# Patient Record
Sex: Male | Born: 1970 | Hispanic: No | Marital: Married | Smoking: Never smoker
Health system: Southern US, Community
[De-identification: ages and names within clinical notes are randomized; demographics above are authoritative.]

## PROBLEM LIST (undated history)

## (undated) DIAGNOSIS — I10 Essential (primary) hypertension: Secondary | ICD-10-CM

## (undated) DIAGNOSIS — L309 Dermatitis, unspecified: Secondary | ICD-10-CM

## (undated) DIAGNOSIS — J45909 Unspecified asthma, uncomplicated: Secondary | ICD-10-CM

## (undated) HISTORY — PX: VASECTOMY: SHX75

---

## 2008-02-08 ENCOUNTER — Other Ambulatory Visit: Admission: RE | Admit: 2008-02-08 | Discharge: 2008-02-08 | Payer: Self-pay | Admitting: Urology

## 2009-03-15 ENCOUNTER — Emergency Department (HOSPITAL_COMMUNITY): Admission: EM | Admit: 2009-03-15 | Discharge: 2009-03-15 | Payer: Self-pay | Admitting: Emergency Medicine

## 2009-03-27 ENCOUNTER — Emergency Department (HOSPITAL_COMMUNITY): Admission: EM | Admit: 2009-03-27 | Discharge: 2009-03-27 | Payer: Self-pay | Admitting: Family Medicine

## 2016-01-30 ENCOUNTER — Ambulatory Visit: Payer: Self-pay | Admitting: Allergy and Immunology

## 2016-01-31 ENCOUNTER — Ambulatory Visit: Payer: Self-pay | Admitting: Allergy and Immunology

## 2016-03-16 ENCOUNTER — Encounter (HOSPITAL_COMMUNITY): Payer: Self-pay

## 2016-03-16 ENCOUNTER — Emergency Department (HOSPITAL_COMMUNITY): Payer: BC Managed Care – PPO

## 2016-03-16 ENCOUNTER — Emergency Department (HOSPITAL_COMMUNITY)
Admission: EM | Admit: 2016-03-16 | Discharge: 2016-03-16 | Disposition: A | Discharge status: Home / Self Care | Payer: BC Managed Care – PPO | Attending: Emergency Medicine | Admitting: Emergency Medicine

## 2016-03-16 DIAGNOSIS — I10 Essential (primary) hypertension: Secondary | ICD-10-CM | POA: Insufficient documentation

## 2016-03-16 DIAGNOSIS — J45909 Unspecified asthma, uncomplicated: Secondary | ICD-10-CM | POA: Insufficient documentation

## 2016-03-16 DIAGNOSIS — Z79899 Other long term (current) drug therapy: Secondary | ICD-10-CM | POA: Diagnosis not present

## 2016-03-16 DIAGNOSIS — J069 Acute upper respiratory infection, unspecified: Secondary | ICD-10-CM | POA: Insufficient documentation

## 2016-03-16 DIAGNOSIS — R0602 Shortness of breath: Secondary | ICD-10-CM | POA: Diagnosis present

## 2016-03-16 HISTORY — DX: Essential (primary) hypertension: I10

## 2016-03-16 HISTORY — DX: Unspecified asthma, uncomplicated: J45.909

## 2016-03-16 HISTORY — DX: Dermatitis, unspecified: L30.9

## 2016-03-16 LAB — CBC
HCT: 52 % (ref 39.0–52.0)
Hemoglobin: 18.1 g/dL — ABNORMAL HIGH (ref 13.0–17.0)
MCH: 31.6 pg (ref 26.0–34.0)
MCHC: 34.8 g/dL (ref 30.0–36.0)
MCV: 90.9 fL (ref 78.0–100.0)
PLATELETS: 232 10*3/uL (ref 150–400)
RBC: 5.72 MIL/uL (ref 4.22–5.81)
RDW: 12.7 % (ref 11.5–15.5)
WBC: 7.3 10*3/uL (ref 4.0–10.5)

## 2016-03-16 LAB — I-STAT TROPONIN, ED
TROPONIN I, POC: 0 ng/mL (ref 0.00–0.08)
Troponin i, poc: 0 ng/mL (ref 0.00–0.08)

## 2016-03-16 LAB — URINALYSIS, ROUTINE W REFLEX MICROSCOPIC
BILIRUBIN URINE: NEGATIVE
GLUCOSE, UA: NEGATIVE mg/dL
HGB URINE DIPSTICK: NEGATIVE
Ketones, ur: NEGATIVE mg/dL
Leukocytes, UA: NEGATIVE
NITRITE: NEGATIVE
PH: 8 (ref 5.0–8.0)
Protein, ur: NEGATIVE mg/dL
SPECIFIC GRAVITY, URINE: 1.009 (ref 1.005–1.030)

## 2016-03-16 LAB — BASIC METABOLIC PANEL
Anion gap: 10 (ref 5–15)
BUN: 7 mg/dL (ref 6–20)
CHLORIDE: 98 mmol/L — AB (ref 101–111)
CO2: 29 mmol/L (ref 22–32)
CREATININE: 1.32 mg/dL — AB (ref 0.61–1.24)
Calcium: 9.8 mg/dL (ref 8.9–10.3)
GFR calc Af Amer: >60 mL/min (ref >60)
GFR calc non Af Amer: >60 mL/min (ref >60)
GLUCOSE: 91 mg/dL (ref 65–99)
Potassium: 3.5 mmol/L (ref 3.5–5.1)
Sodium: 137 mmol/L (ref 135–145)

## 2016-03-16 LAB — D-DIMER, QUANTITATIVE: D-Dimer, Quant: 0.33 ug/mL-FEU (ref 0.00–0.50)

## 2016-03-16 MED ORDER — ACETAMINOPHEN 325 MG PO TABS
650.0000 mg | ORAL_TABLET | Freq: Once | ORAL | Status: AC
Start: 2016-03-16 — End: 2016-03-16
  Administered 2016-03-16: 650 mg via ORAL
  Filled 2016-03-16: qty 2

## 2016-03-16 MED ORDER — SODIUM CHLORIDE 0.9 % IV BOLUS (SEPSIS)
1000.0000 mL | Freq: Once | INTRAVENOUS | Status: AC
Start: 2016-03-16 — End: 2016-03-16
  Administered 2016-03-16: 1000 mL via INTRAVENOUS

## 2016-03-16 MED ORDER — IPRATROPIUM-ALBUTEROL 0.5-2.5 (3) MG/3ML IN SOLN
3.0000 mL | Freq: Once | RESPIRATORY_TRACT | Status: AC
  Administered 2016-03-16: 3 mL via RESPIRATORY_TRACT
  Filled 2016-03-16: qty 3

## 2016-03-16 NOTE — ED Notes (Signed)
Lab at bedside

## 2016-03-16 NOTE — ED Provider Notes (Signed)
[facility] DEPT Provider Note   CSN: [id number] Arrival date & time: 03/16/16  1116     History   Chief Complaint Chief Complaint  Patient presents with  . Chest Pain  . Shortness of Breath    HPI Nicholas Pagliarulo. is a 45 y.o. male.  HPI Nicholas Presto. is a 45 y.o. male with PMH significant for asthma, eczema, and hypertension who presents with gradual onset, constant, mild, non-radiating chest pain that began last night. He describes it as a pressure. Associated symptoms include shortness of breath and feeling lightheaded. He states he has also been having nasal congestion, chest congestion, intermittent cough, frontal headache, and myalgias. He denies any fever, chills, wheezing, slurred speech, unilateral weakness, numbness, visual disturbances, eye pain, nausea, vomiting, diarrhea, or abdominal pain. He endorses sick contacts with similar symptoms. He has been taking over-the-counter cold medicines with minimal relief. Chest pain is not exertional or pleuritic. He denies any current chest pain. No family history of MI prior to age 43. No personal cardiac history. No history of DVT/PE. No recent surgery or immobilization. No unilateral leg swelling. He does not smoke.  He states he just moved here from [place] and has a new PCP.  Past Medical History:  Diagnosis Date  . Asthma   . Eczema   . Hypertension     There are no active problems to display for this patient.   History reviewed. No pertinent surgical history.     Home Medications    Prior to Admission medications   Medication Sig Start Date End Date Taking? Authorizing Provider  albuterol (PROVENTIL HFA;VENTOLIN HFA) 108 (90 Base) MCG/ACT inhaler Inhale 2 puffs into the lungs every 6 (six) hours as needed for wheezing or shortness of breath.   Yes Historical Provider, MD  albuterol (PROVENTIL) (2.5 MG/3ML) 0.083% nebulizer solution Take 2.5 mg by nebulization every 6 (six) hours as needed for  wheezing or shortness of breath.   Yes Historical Provider, MD  budesonide-formoterol (SYMBICORT) 160-4.5 MCG/ACT inhaler Inhale 2 puffs into the lungs 2 (two) times daily.   Yes Historical Provider, MD  loratadine (CLARITIN) 10 MG tablet Take 10 mg by mouth daily.   Yes Historical Provider, MD  triamterene-hydrochlorothiazide (MAXZIDE-25) 37.5-25 MG tablet Take 1 tablet by mouth daily.   Yes Historical Provider, MD    Family History History reviewed. No pertinent family history.  Social History Social History  Substance Use Topics  . Smoking status: Never Smoker  . Smokeless tobacco: Never Used  . Alcohol use No     Allergies   Patient has no known allergies.   Review of Systems Review of Systems All other systems negative unless otherwise stated in HPI   Physical Exam Updated Vital Signs BP 131/92 (BP Location: Right Arm)   Pulse 98   Temp 99.9 F (37.7 C) (Oral)   Resp 22   Ht 5' 5"  (1.651 m)   Wt 78.2 kg   SpO2 97%   BMI 28.70 kg/m   Physical Exam  Constitutional: He is oriented to person, place, and time. He appears well-developed and well-nourished. He is active.  Non-toxic appearance. He does not have a sickly appearance. He does not appear ill.  HENT:  Head: Normocephalic and atraumatic.  Right Ear: Tympanic membrane and external ear normal. Tympanic membrane is not erythematous and not bulging.  Left Ear: Tympanic membrane and external ear normal. Tympanic membrane is not erythematous and not bulging.  Nose: Nose normal. Right sinus  exhibits no maxillary sinus tenderness and no frontal sinus tenderness. Left sinus exhibits no maxillary sinus tenderness and no frontal sinus tenderness.  Mouth/Throat: Uvula is midline, oropharynx is clear and moist and mucous membranes are normal. No trismus in the jaw. No uvula swelling. No oropharyngeal exudate, posterior oropharyngeal edema, posterior oropharyngeal erythema or tonsillar abscesses.  Neck: Normal range of  motion. Neck supple.  No nuchal rigidity.   Cardiovascular: Regular rhythm.  Tachycardia present.   Pulmonary/Chest: Effort normal and breath sounds normal. No respiratory distress. He has no wheezes. He has no rales.  Abdominal: Soft. Bowel sounds are normal. He exhibits no distension. There is no tenderness.  Musculoskeletal: Normal range of motion.  Lymphadenopathy:    He has no cervical adenopathy.  Neurological: He is alert and oriented to person, place, and time.  Mental Status:   AOx3.  Speech clear without dysarthria. Cranial Nerves:  I-not tested  II-PERRLA  III, IV, VI-EOMs intact  V-temporal and masseter strength intact  VII-symmetrical facial movements intact, no facial droop  VIII-hearing grossly intact bilaterally  IX, X-gag intact  XI-strength of sternomastoid and trapezius muscles 5/5  XII-tongue midline Motor:   Good muscle bulk and tone  Strength 5/5 bilaterally in upper and lower extremities   Cerebellar--intact RAMs, finger to nose intact bilaterally.  Gait normal  No pronator drift Sensory:  Intact in upper and lower extremities   Skin: Skin is warm and dry.  Psychiatric: He has a normal mood and affect. His behavior is normal.     ED Treatments / Results  Labs (all labs ordered are listed, but only abnormal results are displayed) Labs Reviewed  BASIC METABOLIC PANEL - Abnormal; Notable for the following:       Result Value   Chloride 98 (*)    Creatinine, Ser 1.32 (*)    All other components within normal limits  CBC - Abnormal; Notable for the following:    Hemoglobin 18.1 (*)    All other components within normal limits  URINALYSIS, ROUTINE W REFLEX MICROSCOPIC (NOT AT [facility])  D-DIMER, QUANTITATIVE (NOT AT [facility])  Randolm Idol, ED  I-STAT TROPOININ, ED    EKG  EKG Interpretation  Date/Time:  Monday March 16 2016 11:26:32 EST Ventricular Rate:  102 PR Interval:  132 QRS Duration: 96 QT Interval:  326 QTC Calculation: 424 R  Axis:   55 Text Interpretation:  Sinus tachycardia Otherwise normal ECG No old tracing to compare Confirmed by GOLDSTON MD, SCOTT [id number]) on 03/16/2016 11:47:04 AM       Radiology Dg Chest 2 View  Result Date: 03/16/2016 CLINICAL DATA:  Acute chest pain. EXAM: CHEST  2 VIEW COMPARISON:  04/03/2015 and prior radiographs FINDINGS: This is a mildly low volume film. The cardiomediastinal silhouette is unremarkable. There is no evidence of focal airspace disease, pulmonary edema, suspicious pulmonary nodule/mass, pleural effusion, or pneumothorax. No acute bony abnormalities are identified. IMPRESSION: Mildly low volume film -no evidence of active cardiopulmonary disease. Electronically Signed   By: Margarette Canada M.D.   On: 03/16/2016 14:51    Procedures Procedures (including critical care time)  Medications Ordered in ED Medications  acetaminophen (TYLENOL) tablet 650 mg (not administered)  sodium chloride 0.9 % bolus 1,000 mL (0 mLs Intravenous Stopped 03/16/16 1555)  ipratropium-albuterol (DUONEB) 0.5-2.5 (3) MG/3ML nebulizer solution 3 mL (3 mLs Nebulization Given 03/16/16 1407)     Initial Impression / Assessment and Plan / ED Course  I have reviewed the triage vital signs and the  nursing notes.  Pertinent labs & imaging results that were available during my care of the patient were reviewed by me and considered in my medical decision making (see chart for details).  Clinical Course    Patient presents with likely viral URI.  CXR negative for acute infiltrate.  EKG without ischemia.  Delta troponin negative.  Dimer negative.  Low suspicion for ACS (HEAR score 2), PE, dissection, PNA, or pericarditis.  Labs without acute abnormalities.  HR improved with fluids.  Discussed supportive care and plenty of fluids.  No indication for abx at this time.  Follow up PCP.  Return precautions discussed including worsening symptoms, chest pain, shortness of breath, fever, or any new or concerning  symptoms.   Case has been discussed with Dr. Regenia Skeeter who agrees with the above plan for discharge.   Final Clinical Impressions(s) / ED Diagnoses   Final diagnoses:  Upper respiratory tract infection, unspecified type    New Prescriptions New Prescriptions   No medications on file     Gloriann Loan, PA-C 03/16/16 [place], MD 03/17/16 [phone]

## 2016-03-16 NOTE — ED Triage Notes (Signed)
Pt. Reports having anterior chest pain beginning last night described as a tightness.  Denies any aggravating factor. The pain is non-radiating . He also is having sob. Lightheaded and dizziness.  Mild cough, pt. Has a hx. Of asthma, Skin is warm and dry.  NO acute distress in the ED.  ECG completed in Triage, Lungs clear bilaterally

## 2016-03-16 NOTE — ED Notes (Signed)
Pt returned from X Ray.

## 2017-05-14 ENCOUNTER — Encounter (HOSPITAL_COMMUNITY): Payer: Self-pay

## 2017-05-14 ENCOUNTER — Emergency Department (HOSPITAL_COMMUNITY)
Admission: EM | Admit: 2017-05-14 | Discharge: 2017-05-14 | Disposition: A | Discharge status: Home / Self Care | Payer: BC Managed Care – PPO | Attending: Emergency Medicine | Admitting: Emergency Medicine

## 2017-05-14 ENCOUNTER — Other Ambulatory Visit: Payer: Self-pay

## 2017-05-14 DIAGNOSIS — M62830 Muscle spasm of back: Secondary | ICD-10-CM | POA: Insufficient documentation

## 2017-05-14 DIAGNOSIS — I10 Essential (primary) hypertension: Secondary | ICD-10-CM | POA: Diagnosis not present

## 2017-05-14 DIAGNOSIS — J45909 Unspecified asthma, uncomplicated: Secondary | ICD-10-CM | POA: Insufficient documentation

## 2017-05-14 DIAGNOSIS — Z79899 Other long term (current) drug therapy: Secondary | ICD-10-CM | POA: Insufficient documentation

## 2017-05-14 DIAGNOSIS — M549 Dorsalgia, unspecified: Secondary | ICD-10-CM | POA: Diagnosis present

## 2017-05-14 MED ORDER — CYCLOBENZAPRINE HCL 10 MG PO TABS: 10.0000 mg | ORAL_TABLET | Freq: Two times a day (BID) | ORAL | 0 refills | Status: DC | PRN

## 2017-05-14 MED ORDER — METHYLPREDNISOLONE SODIUM SUCC 125 MG IJ SOLR
80.0000 mg | Freq: Once | INTRAMUSCULAR | Status: AC
  Administered 2017-05-14: 80 mg via INTRAMUSCULAR
  Filled 2017-05-14: qty 2

## 2017-05-14 NOTE — ED Triage Notes (Signed)
Pt reports lifting something heavy on Monday and then began experiencing lower back pain on Wednesday. He reports that the pain feels like "muscle spasms." Denies any urinary symptoms. A&Ox4. Ambulatory,

## 2017-05-14 NOTE — ED Notes (Signed)
PT DISCHARGED. INSTRUCTIONS AND PRESCRIPTION GIVEN. AAOX4. PT IN NO APPARENT DISTRESS WITH SEVERE PAIN. THE OPPORTUNITY TO ASK QUESTIONS WAS PROVIDED.

## 2017-05-14 NOTE — Discharge Instructions (Signed)
Do not drive or work while taking the muscle relaxer as it will make you sleepy. Take your Advil as directed. Follow up with your doctor or return here for worsening symptoms.

## 2017-05-14 NOTE — ED Provider Notes (Signed)
[facility] DEPT Provider Note   CSN: [id number] Arrival date & time: 05/14/17  1732     History   Chief Complaint Chief Complaint  Patient presents with  . Back Pain    HPI Nicholas Moss. is a 47 y.o. male who presents to the ED with low back pain. The pain started 4 days ago after lifting and moving stuff on his job that was different from his usual job. Patient reports the feeling of muscle spasm. Patient denies loss of control of bladder or any UTI symptoms. Patient reports having similar back problems in the past and treated for muscle spasm.  Patient denies any other problems.   HPI  Past Medical History:  Diagnosis Date  . Asthma   . Eczema   . Hypertension     There are no active problems to display for this patient.   History reviewed. No pertinent surgical history.     Home Medications    Prior to Admission medications   Medication Sig Start Date End Date Taking? Authorizing Provider  budesonide-formoterol (SYMBICORT) 160-4.5 MCG/ACT inhaler Inhale 2 puffs into the lungs 2 (two) times daily.   Yes [provider]  Diclofenac Sodium (PENNSAID) 2 % SOLN Place 2 Bottles onto the skin 2 (two) times daily as needed.   Yes [provider]  ibuprofen (ADVIL,MOTRIN) 200 MG tablet Take 400 mg by mouth 2 (two) times daily as needed (BACK PAIN).   Yes [provider]  triamterene-hydrochlorothiazide (MAXZIDE-25) 37.5-25 MG tablet Take 1 tablet by mouth daily.   Yes [provider]  cyclobenzaprine (FLEXERIL) 10 MG tablet Take 1 tablet (10 mg total) by mouth 2 (two) times daily as needed for muscle spasms. 05/14/17   Ashley Murrain, NP    Family History History reviewed. No pertinent family history.  Social History Social History   Tobacco Use  . Smoking status: Never Smoker  . Smokeless tobacco: Never Used  Substance Use Topics  . Alcohol use: No  . Drug use: No     Allergies   Patient  has no known allergies.   Review of Systems Review of Systems  Musculoskeletal: Positive for back pain.  All other systems reviewed and are negative.    Physical Exam Updated Vital Signs BP (!) 126/96 (BP Location: Left Arm)   Pulse 66   Temp 98.3 F (36.8 C) (Oral)   Resp 18   SpO2 99%   Physical Exam  Constitutional: He appears well-developed and well-nourished. No distress.  HENT:  Head: Normocephalic and atraumatic.  Eyes: Conjunctivae and EOM are normal.  Neck: Normal range of motion. Neck supple.  Cardiovascular: Normal rate and regular rhythm.  Pulmonary/Chest: Breath sounds normal. He is in respiratory distress.  Abdominal: There is no CVA tenderness.  Musculoskeletal:       Lumbar back: He exhibits tenderness and spasm. He exhibits no deformity and normal pulse. Decreased range of motion: due to pain.       Back:  Neurological: He is alert. He has normal strength. No cranial nerve deficit or sensory deficit. Gait normal.  Reflex Scores:      Bicep reflexes are 2+ on the right side and 2+ on the left side.      Brachioradialis reflexes are 2+ on the right side and 2+ on the left side.      Patellar reflexes are 2+ on the right side and 2+ on the left side. Skin: Skin is warm and dry.  Psychiatric: He has a normal mood and affect. His behavior is normal.  Nursing note and vitals reviewed.    ED Treatments / Results  Labs (all labs ordered are listed, but only abnormal results are displayed) Labs Reviewed - No data to display  EKG  EKG Interpretation None       Radiology No results found.  Procedures Procedures (including critical care time)  Medications Ordered in ED Medications  methylPREDNISolone sodium succinate (SOLU-MEDROL) 125 mg/2 mL injection 80 mg (not administered)     Initial Impression / Assessment and Plan / ED Course  I have reviewed the triage vital signs and the nursing notes. Patient with back pain.  No neurological deficits  and normal neuro exam.  Patient can walk but states is painful.  No loss of bowel or bladder control.  No concern for cauda equina.  No fever, night sweats, weight loss, h/o cancer, IVDU.  RICE protocol and pain medicine indicated and discussed with patient.  Final Clinical Impressions(s) / ED Diagnoses   Final diagnoses:  Muscle spasm of back    ED Discharge Orders        Ordered    cyclobenzaprine (FLEXERIL) 10 MG tablet  2 times daily PRN     05/14/17 1903       Debroah Baller [place], NP 05/14/17 1910    Davonna Belling, MD 05/14/17 [phone]

## 2017-11-21 ENCOUNTER — Encounter (HOSPITAL_COMMUNITY): Payer: Self-pay | Admitting: Obstetrics and Gynecology

## 2017-11-21 ENCOUNTER — Other Ambulatory Visit: Payer: Self-pay

## 2017-11-21 ENCOUNTER — Emergency Department (HOSPITAL_COMMUNITY)
Admission: EM | Admit: 2017-11-21 | Discharge: 2017-11-21 | Disposition: A | Discharge status: Home / Self Care | Payer: BC Managed Care – PPO | Attending: Emergency Medicine | Admitting: Emergency Medicine

## 2017-11-21 DIAGNOSIS — Y93E5 Activity, floor mopping and cleaning: Secondary | ICD-10-CM | POA: Diagnosis not present

## 2017-11-21 DIAGNOSIS — J45909 Unspecified asthma, uncomplicated: Secondary | ICD-10-CM | POA: Diagnosis not present

## 2017-11-21 DIAGNOSIS — S39012A Strain of muscle, fascia and tendon of lower back, initial encounter: Secondary | ICD-10-CM | POA: Diagnosis not present

## 2017-11-21 DIAGNOSIS — Z79899 Other long term (current) drug therapy: Secondary | ICD-10-CM | POA: Insufficient documentation

## 2017-11-21 DIAGNOSIS — I10 Essential (primary) hypertension: Secondary | ICD-10-CM | POA: Diagnosis not present

## 2017-11-21 DIAGNOSIS — L309 Dermatitis, unspecified: Secondary | ICD-10-CM | POA: Diagnosis not present

## 2017-11-21 DIAGNOSIS — S3992XA Unspecified injury of lower back, initial encounter: Secondary | ICD-10-CM | POA: Diagnosis present

## 2017-11-21 DIAGNOSIS — M62830 Muscle spasm of back: Secondary | ICD-10-CM | POA: Insufficient documentation

## 2017-11-21 DIAGNOSIS — Y92219 Unspecified school as the place of occurrence of the external cause: Secondary | ICD-10-CM | POA: Diagnosis not present

## 2017-11-21 DIAGNOSIS — Y990 Civilian activity done for income or pay: Secondary | ICD-10-CM | POA: Insufficient documentation

## 2017-11-21 DIAGNOSIS — X500XXA Overexertion from strenuous movement or load, initial encounter: Secondary | ICD-10-CM | POA: Diagnosis not present

## 2017-11-21 MED ORDER — PREDNISONE 20 MG PO TABS
60.0000 mg | ORAL_TABLET | Freq: Once | ORAL | Status: AC
  Administered 2017-11-21: 60 mg via ORAL
  Filled 2017-11-21: qty 3

## 2017-11-21 MED ORDER — METHOCARBAMOL 500 MG PO TABS: 500.0000 mg | ORAL_TABLET | Freq: Two times a day (BID) | ORAL | 0 refills | Status: DC

## 2017-11-21 MED ORDER — DICLOFENAC SODIUM 50 MG PO TBEC: 50.0000 mg | DELAYED_RELEASE_TABLET | Freq: Two times a day (BID) | ORAL | 0 refills | Status: DC

## 2017-11-21 MED ORDER — KETOROLAC TROMETHAMINE 60 MG/2ML IM SOLN
30.0000 mg | Freq: Once | INTRAMUSCULAR | Status: AC
  Administered 2017-11-21: 30 mg via INTRAMUSCULAR
  Filled 2017-11-21: qty 2

## 2017-11-21 MED ORDER — PREDNISONE 50 MG PO TABS: ORAL_TABLET | ORAL | 0 refills | Status: DC

## 2017-11-21 NOTE — ED Triage Notes (Signed)
Per Pt: Pt reports he works for [employer] and they have been taking up the floors, waxing, and placing new floors, which requires a lot of heavy lifting. Pt reports he is having back pain. Pt denies it migrating down either leg. Pt denies urinary symptoms. Pt reports he was taking flexeril, but has had no relief.  Pt able to move all limbs in triage, but reports he feels "stiff"

## 2017-11-21 NOTE — ED Provider Notes (Signed)
[facility] DEPT Provider Note   CSN: [id number] Arrival date & time: 11/21/17  1211     History   Chief Complaint Chief Complaint  Patient presents with  . Back Pain    HPI Nicholas Moss. is a 47 y.o. male who presents to the ED with back pain. Patient reports he works for [employer] and they have been taking up the floors, waxing and putting in new floors. Patient reports that this job has required heavy lifting and has caused him to have back pain. Patient reports taking Flexeril without relief. Patient denies loss of control of bladder or bowels and states the pain does not radiate.   The history is provided by the patient. No language interpreter was used.  Back Pain   The current episode started more than 2 days ago. The problem occurs constantly. The problem has been gradually worsening. The pain is associated with lifting heavy objects. The pain is present in the lumbar spine. The quality of the pain is described as aching. The pain does not radiate. The pain is at a severity of 9/10. The symptoms are aggravated by bending, twisting and certain positions. The pain is the same all the time. Pertinent negatives include no fever, no numbness, no abdominal pain, no bowel incontinence, no bladder incontinence, no dysuria, no paresis and no weakness. He has tried muscle relaxants and heat for the symptoms. The treatment provided mild relief.    Past Medical History:  Diagnosis Date  . Asthma   . Eczema   . Hypertension     There are no active problems to display for this patient.   History reviewed. No pertinent surgical history.      Home Medications    Prior to Admission medications   Medication Sig Start Date End Date Taking? Authorizing Provider  budesonide-formoterol (SYMBICORT) 160-4.5 MCG/ACT inhaler Inhale 2 puffs into the lungs 2 (two) times daily.    [provider]  diclofenac (VOLTAREN) 50 MG EC tablet Take 1  tablet (50 mg total) by mouth 2 (two) times daily. 11/21/17   Ashley Murrain, NP  Diclofenac Sodium (PENNSAID) 2 % SOLN Place 2 Bottles onto the skin 2 (two) times daily as needed.    [provider]  methocarbamol (ROBAXIN) 500 MG tablet Take 1 tablet (500 mg total) by mouth 2 (two) times daily. 11/21/17   Ashley Murrain, NP  predniSONE (DELTASONE) 50 MG tablet Starting tomorrow 11/22/17 take one tablet PO daily 11/21/17   Ashley Murrain, NP  triamterene-hydrochlorothiazide (MAXZIDE-25) 37.5-25 MG tablet Take 1 tablet by mouth daily.    [provider]    Family History No family history on file.  Social History Social History   Tobacco Use  . Smoking status: Never Smoker  . Smokeless tobacco: Never Used  Substance Use Topics  . Alcohol use: No  . Drug use: No     Allergies   Patient has no known allergies.   Review of Systems Review of Systems  Constitutional: Negative for fever.  HENT: Negative.   Eyes: Negative for visual disturbance.  Gastrointestinal: Negative for abdominal pain and bowel incontinence.  Genitourinary: Negative for bladder incontinence and dysuria.  Musculoskeletal: Positive for back pain.  Skin: Positive for rash.       Itching, eczema  Neurological: Negative for weakness and numbness.     Physical Exam Updated Vital Signs BP 113/78   Pulse 91   Temp 98.2 F (36.8 C) (  Oral)   Resp 16   SpO2 98%   Physical Exam  Constitutional: He appears well-developed and well-nourished. No distress.  HENT:  Head: Normocephalic.  Eyes: Conjunctivae and EOM are normal.  Neck: Normal range of motion. Neck supple.  Cardiovascular: Normal rate.  Pulmonary/Chest: Effort normal.  Abdominal: Soft. There is no tenderness.  Musculoskeletal:       Lumbar back: He exhibits tenderness and spasm. He exhibits normal pulse. Decreased range of motion: due to pain.       Back:  Grips are equal, radial pulses 2+.   Neurological: He is alert. He has  normal strength. Gait abnormal.  Reflex Scores:      Bicep reflexes are 2+ on the right side and 2+ on the left side.      Brachioradialis reflexes are 2+ on the right side and 2+ on the left side.      Patellar reflexes are 2+ on the right side. Stands on one foot without difficulty  Skin: Skin is warm and dry. Rash noted.  Eczema to forearms, patient being treated with Kenalog Cream.  Psychiatric: He has a normal mood and affect.  Nursing note and vitals reviewed.    ED Treatments / Results  Labs (all labs ordered are listed, but only abnormal results are displayed) Labs Reviewed - No data to display  EKG None  Radiology No results found.  Procedures Procedures (including critical care time)  Medications Ordered in ED Medications  ketorolac (TORADOL) injection 30 mg (30 mg Intramuscular Given 11/21/17 1309)  predniSONE (DELTASONE) tablet 60 mg (60 mg Oral Given 11/21/17 1309)     Initial Impression / Assessment and Plan / ED Course  I have reviewed the triage vital signs and the nursing notes. Patient with back pain.  No neurological deficits and normal neuro exam.  Patient can walk but states is painful.  No loss of bowel or bladder control.  No concern for cauda equina.  No fever, night sweats, weight loss, h/o cancer, IVDU.  RICE protocol and pain medicine indicated and discussed with patient.   Final Clinical Impressions(s) / ED Diagnoses   Final diagnoses:  Lumbar strain, initial encounter  Spasm of muscle of lower back    ED Discharge Orders         Ordered    methocarbamol (ROBAXIN) 500 MG tablet  2 times daily     11/21/17 1325    predniSONE (DELTASONE) 50 MG tablet     11/21/17 1325    diclofenac (VOLTAREN) 50 MG EC tablet  2 times daily     11/21/17 [place], [place], [place] 11/21/17 1327    Quintella Reichert, MD 11/22/17 0800

## 2017-11-21 NOTE — Discharge Instructions (Addendum)
Follow up with your doctor. Return here as needed. Do not drive while taking the muscle relaxer as it will make you sleepy.

## 2017-12-10 ENCOUNTER — Emergency Department (HOSPITAL_COMMUNITY)
Admission: EM | Admit: 2017-12-10 | Discharge: 2017-12-10 | Disposition: A | Discharge status: Home / Self Care | Payer: BC Managed Care – PPO | Attending: Emergency Medicine | Admitting: Emergency Medicine

## 2017-12-10 ENCOUNTER — Encounter (HOSPITAL_COMMUNITY): Payer: Self-pay | Admitting: *Deleted

## 2017-12-10 ENCOUNTER — Other Ambulatory Visit: Payer: Self-pay

## 2017-12-10 DIAGNOSIS — R21 Rash and other nonspecific skin eruption: Secondary | ICD-10-CM | POA: Diagnosis present

## 2017-12-10 DIAGNOSIS — L309 Dermatitis, unspecified: Secondary | ICD-10-CM | POA: Diagnosis not present

## 2017-12-10 DIAGNOSIS — I10 Essential (primary) hypertension: Secondary | ICD-10-CM | POA: Insufficient documentation

## 2017-12-10 DIAGNOSIS — J45909 Unspecified asthma, uncomplicated: Secondary | ICD-10-CM | POA: Insufficient documentation

## 2017-12-10 DIAGNOSIS — Z79899 Other long term (current) drug therapy: Secondary | ICD-10-CM | POA: Diagnosis not present

## 2017-12-10 MED ORDER — DEXAMETHASONE SODIUM PHOSPHATE 10 MG/ML IJ SOLN
10.0000 mg | Freq: Once | INTRAMUSCULAR | Status: AC
  Administered 2017-12-10: 10 mg via INTRAMUSCULAR
  Filled 2017-12-10: qty 1

## 2017-12-10 MED ORDER — PREDNISONE 20 MG PO TABS: ORAL_TABLET | ORAL | 0 refills | Status: DC

## 2017-12-10 NOTE — ED Provider Notes (Signed)
[facility] DEPT Provider Note   CSN: [id number] Arrival date & time: 12/10/17  0034  Time seen 02:07 AM   History   Chief Complaint Chief Complaint  Patient presents with  . Rash    HPI Nicholas Moss is a 47 y.o. male.  HPI patient states he has a history of eczema.  He states when it first started out he had it on his legs but he has had it on other parts of his body.  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  He was seen by his PCP and referred to a dermatologist in July.  He was given a tube of triamcinolone ointment and then he went back and he got a tub of 454 g of triamcinolone acetonide that he used for 2 weeks and then he was told to stop.  He states his rash was better.  He also has been using Sarna over-the-counter.  When I look at that it has camphor 0.3% and menthol 0.5%.  He also has a prescription of Atarax 25 mg number 90 tablets that were prescribed December 29, 2016.  He states he "ran out" of Benadryl.  He reports he was out of work last week because he hurt his back.  He restarted back to work on Monday, August 26.  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  He is not sure if they are latex or latex free.  However his rash has been getting worse.  He reports over the past 2days increased itching and the rash is getting more prominent.  [facility]  Past Medical History:  Diagnosis Date  . Asthma   . Eczema   . Hypertension     There are no active problems to display for this patient.   History reviewed. No pertinent surgical history.      Home Medications    Prior to Admission medications   Medication Sig Start Date End Date Taking? Authorizing Provider  budesonide-formoterol (SYMBICORT) 160-4.5 MCG/ACT inhaler Inhale 2 puffs into the lungs 2 (two) times daily.    [provider]  diclofenac (VOLTAREN) 50  MG EC tablet Take 1 tablet (50 mg total) by mouth 2 (two) times daily. 11/21/17   Ashley Murrain, NP  Diclofenac Sodium (PENNSAID) 2 % SOLN Place 2 Bottles onto the skin 2 (two) times daily as needed.    [provider]  methocarbamol (ROBAXIN) 500 MG tablet Take 1 tablet (500 mg total) by mouth 2 (two) times daily. 11/21/17   Ashley Murrain, NP  predniSONE (DELTASONE) 20 MG tablet Take 3 po QD x 3d , then 2 po QD x 3d then 1 po QD x 3d 12/10/17   Rolland Porter, MD  triamterene-hydrochlorothiazide (MAXZIDE-25) 37.5-25 MG tablet Take 1 tablet by mouth daily.    [provider]    Family History No family history on file.  Social History Social History   Tobacco Use  . Smoking status: Never Smoker  . Smokeless tobacco: Never Used  Substance Use Topics  . Alcohol use: No  . Drug use: No  employed   Allergies   Patient has no known allergies.   Review of Systems Review of Systems  All other systems reviewed and are negative.    Physical Exam Updated Vital Signs BP (!) 140/103 (BP Location: Left Arm)   Pulse (!) 104  Temp 98 F (36.7 C) (Oral)   Resp 19   SpO2 96%   Vital signs normal except tachycardia, hypertension   Physical Exam  Constitutional: He is oriented to person, place, and time. He appears well-developed and well-nourished. No distress.  HENT:  Head: Normocephalic and atraumatic.  Right Ear: External ear normal.  Left Ear: External ear normal.  Nose: Nose normal.  Eyes: Conjunctivae and EOM are normal.  Neck: Normal range of motion.  Cardiovascular: Normal rate.  Pulmonary/Chest: Effort normal. No respiratory distress.  Musculoskeletal: Normal range of motion. He exhibits no deformity.  Neurological: He is alert and oriented to person, place, and time. A cranial nerve deficit is present.  Skin: Skin is warm and dry. There is erythema.  Patient has well-circumscribed areas on his forearms and the dorsum of his hand of rash with some  thickening of the skin, some redness.  There is no drainage.  Patient is seem to be scratching during our interview.  Nursing note and vitals reviewed.   Left arm    Right arm     ED Treatments / Results  Labs (all labs ordered are listed, but only abnormal results are displayed) Labs Reviewed - No data to display  EKG None  Radiology No results found.  Procedures Procedures (including critical care time)  Medications Ordered in ED Medications  dexamethasone (DECADRON) injection 10 mg (10 mg Intramuscular Given 12/10/17 0255)     Initial Impression / Assessment and Plan / ED Course  I have reviewed the triage vital signs and the nursing notes.  Pertinent labs & imaging results that were available during my care of the patient were reviewed by me and considered in my medical decision making (see chart for details).     Patient was given Decadron IM.  He was discharged home with a course of prednisone.  We discussed he should start using the triamcinolone cream again.  Once he is finished the steroids he should contact the dermatologist to see how long he should wait to get allergy tested.  We also discussed checking his gloves at work to make sure they are latex free in case that is making his rash worse.  He was advised to return if they appear to be getting infected.  Patient has Atarax pills left over and he also can take Benadryl over-the-counter, no prescriptions necessary.  Final Clinical Impressions(s) / ED Diagnoses   Final diagnoses:  Eczema, unspecified type    ED Discharge Orders         Ordered    predniSONE (DELTASONE) 20 MG tablet     12/10/17 0245          Plan discharge  Rolland Porter, MD, Barbette Or, MD 12/10/17 [phone]

## 2017-12-10 NOTE — ED Triage Notes (Signed)
Pt says he was using Triamcinolone Cream and hydroxizine for a rash he had in July, now the rash is getting worse. Rash is only on his arms and is itchy.

## 2017-12-10 NOTE — Discharge Instructions (Addendum)
Check your gloves at work and make sure they do not have latex in them.  If they do start using latex free gloves.  Start using the triamcinolone cream the dermatologist gave you.  Take the prednisone taper as prescribed.  Once you are off the steroids you can contact the dermatologist office again to see about getting the allergy testing done.  Return to the emergency department if your rash gets worse, you feel like the rash is getting infected and it is draining pus, you get a fever, your skin gets increased redness and warmth.

## 2018-03-01 ENCOUNTER — Ambulatory Visit: Payer: BC Managed Care – PPO | Admitting: Family

## 2018-04-27 ENCOUNTER — Emergency Department (HOSPITAL_COMMUNITY)
Admission: EM | Admit: 2018-04-27 | Discharge: 2018-04-28 | Disposition: A | Discharge status: Home / Self Care | Payer: BC Managed Care – PPO | Attending: Emergency Medicine | Admitting: Emergency Medicine

## 2018-04-27 ENCOUNTER — Other Ambulatory Visit: Payer: Self-pay

## 2018-04-27 ENCOUNTER — Emergency Department (HOSPITAL_COMMUNITY): Payer: BC Managed Care – PPO

## 2018-04-27 ENCOUNTER — Encounter (HOSPITAL_COMMUNITY): Payer: Self-pay | Admitting: Emergency Medicine

## 2018-04-27 DIAGNOSIS — J45909 Unspecified asthma, uncomplicated: Secondary | ICD-10-CM | POA: Insufficient documentation

## 2018-04-27 DIAGNOSIS — I10 Essential (primary) hypertension: Secondary | ICD-10-CM | POA: Diagnosis not present

## 2018-04-27 DIAGNOSIS — R079 Chest pain, unspecified: Secondary | ICD-10-CM | POA: Diagnosis not present

## 2018-04-27 LAB — POCT I-STAT TROPONIN I: Troponin i, poc: 0 ng/mL (ref 0.00–0.08)

## 2018-04-27 LAB — BASIC METABOLIC PANEL
Anion gap: 12 (ref 5–15)
BUN: 10 mg/dL (ref 6–20)
CO2: 23 mmol/L (ref 22–32)
Calcium: 9.2 mg/dL (ref 8.9–10.3)
Chloride: 101 mmol/L (ref 98–111)
Creatinine, Ser: 1.17 mg/dL (ref 0.61–1.24)
GFR calc Af Amer: >60 mL/min (ref >60)
GFR calc non Af Amer: >60 mL/min (ref >60)
Glucose, Bld: 109 mg/dL — ABNORMAL HIGH (ref 70–99)
POTASSIUM: 3.2 mmol/L — AB (ref 3.5–5.1)
Sodium: 136 mmol/L (ref 135–145)

## 2018-04-27 LAB — CBC
HEMATOCRIT: 49.3 % (ref 39.0–52.0)
HEMOGLOBIN: 16.5 g/dL (ref 13.0–17.0)
MCH: 31 pg (ref 26.0–34.0)
MCHC: 33.5 g/dL (ref 30.0–36.0)
MCV: 92.5 fL (ref 80.0–100.0)
Platelets: 271 10*3/uL (ref 150–400)
RBC: 5.33 MIL/uL (ref 4.22–5.81)
RDW: 11.9 % (ref 11.5–15.5)
WBC: 8.5 10*3/uL (ref 4.0–10.5)
nRBC: 0 % (ref 0.0–0.2)

## 2018-04-27 MED ORDER — PREDNISONE 20 MG PO TABS
60.0000 mg | ORAL_TABLET | Freq: Once | ORAL | Status: AC
  Administered 2018-04-28: 60 mg via ORAL
  Filled 2018-04-27: qty 3

## 2018-04-27 MED ORDER — SODIUM CHLORIDE 0.9% FLUSH: 3.0000 mL | Freq: Once | INTRAVENOUS | Status: DC

## 2018-04-27 MED ORDER — KETOROLAC TROMETHAMINE 30 MG/ML IJ SOLN
30.0000 mg | Freq: Once | INTRAMUSCULAR | Status: AC
  Administered 2018-04-27: 30 mg via INTRAMUSCULAR
  Filled 2018-04-27: qty 1

## 2018-04-27 MED ORDER — IPRATROPIUM-ALBUTEROL 0.5-2.5 (3) MG/3ML IN SOLN
3.0000 mL | RESPIRATORY_TRACT | Status: DC
  Administered 2018-04-28: 3 mL via RESPIRATORY_TRACT
  Filled 2018-04-27: qty 3

## 2018-04-27 NOTE — ED Provider Notes (Signed)
Emergency Department Provider Note   I have reviewed the triage vital signs and the nursing notes.   HISTORY  Chief Complaint Chest Pain   HPI Nicholas Moss is a 48 y.o. male with medical problems document below the presents to the emergency department today with right-sided chest pain.  Patient states that this is been going on for about a week.  States it is worse when he moves around and is sharp in nature.  Symptoms are radiates to his right shoulder sometimes it stays the same.  He has some mild shortness of breath with it.  He had wheezing at some point and he tried a albuterol which seemed to help it.  He does not happen every time he moves.  He also has associated nausea today but not related to the chest pain.  No diaphoresis or syncope.  Intermittent lightheadedness that he cannot describe very well.  No recent fevers, chills, cough.  No rashes.  No trauma.  No history of the same.  Not related to eating. No other associated or modifying symptoms.    Past Medical History:  Diagnosis Date  . Asthma   . Eczema   . Hypertension     There are no active problems to display for this patient.   History reviewed. No pertinent surgical history.  Current Outpatient Rx  . Order #: [id number] Class: Historical Med  . Order #: [id number] Class: Normal  . Order #: [id number] Class: Historical Med  . Order #: [id number] Class: Normal  . Order #: [id number] Class: Print  . Order #: [id number] Class: Historical Med    Allergies Patient has no known allergies.  No family history on file.  Social History Social History   Tobacco Use  . Smoking status: Never Smoker  . Smokeless tobacco: Never Used  Substance Use Topics  . Alcohol use: No  . Drug use: No    Review of Systems  All other systems negative except as documented in the HPI. All pertinent positives and negatives as reviewed in the HPI. ____________________________________________   PHYSICAL EXAM:  VITAL SIGNS: ED  Triage Vitals [04/27/18 2144]  Enc Vitals Group     BP (!) 143/104     Pulse Rate 75     Resp 20     Temp 98.3 F (36.8 C)     Temp src      SpO2 96 %    Constitutional: Alert and oriented. Well appearing and in no acute distress. Eyes: Conjunctivae are normal. PERRL. EOMI. Head: Atraumatic. Nose: No congestion/rhinnorhea. Mouth/Throat: Mucous membranes are moist.  Oropharynx non-erythematous. Neck: No stridor.  No meningeal signs.   Cardiovascular: Normal rate, regular rhythm. Good peripheral circulation. Grossly normal heart sounds.   Respiratory: Normal respiratory effort.  No retractions. Lungs CTAB. Gastrointestinal: Soft and nontender. No distention.  Musculoskeletal: No lower extremity tenderness nor edema. No gross deformities of extremities. Neurologic:  Normal speech and language. No gross focal neurologic deficits are appreciated.  Skin:  Skin is warm, dry and intact. No rash noted.   ____________________________________________   LABS (all labs ordered are listed, but only abnormal results are displayed)  Labs Reviewed  BASIC METABOLIC PANEL - Abnormal; Notable for the following components:      Result Value   Potassium 3.2 (*)    Glucose, Bld 109 (*)    All other components within normal limits  CBC  I-STAT TROPONIN, ED  POCT I-STAT TROPONIN I   ____________________________________________  EKG    My ECG Read Indication:chest pain  EKG was personally contemporaneously reviewed by myself. Rate: 71 PR Interval: 135 QRS duration: 112 QT/QTC: 385/419 Axis: normal EKG: normal EKG, normal sinus rhythm, unchanged from previous tracings. Other significant findings: none  ____________________________________________  RADIOLOGY  Dg Chest 2 View  Result Date: 04/27/2018 CLINICAL DATA:  Intermittent central sharp chest pain for 1 week. Tingling in the right arm. EXAM: CHEST - 2 VIEW COMPARISON:  03/16/2016 FINDINGS: Normal heart size and pulmonary  vascularity. No focal airspace disease or consolidation in the lungs. No blunting of costophrenic angles. No pneumothorax. Mediastinal contours appear intact. Degenerative changes in the spine. No change since prior study. IMPRESSION: No active cardiopulmonary disease. Electronically Signed   By: Lucienne Capers M.D.   On: 04/27/2018 22:03    ____________________________________________   PROCEDURES  Procedure(s) performed:   Procedures   ____________________________________________   INITIAL IMPRESSION / ASSESSMENT AND PLAN / ED COURSE Chest pain - less so dyspnea/lightheadedness - low HEART score, delta troponin. PCP follow up - PERC negative, doubt PE -diminished BS, asthma? Bronchitis? Duoneb/prednisone/toradol here.    Some improvement with treatments here. Delta toponins unremakrable. Cp free. Will dc to fu w/ pcp.      Pertinent labs & imaging results that were available during my care of the patient were reviewed by me and considered in my medical decision making (see chart for details).  ____________________________________________  FINAL CLINICAL IMPRESSION(S) / ED DIAGNOSES  Final diagnoses:  None     MEDICATIONS GIVEN DURING THIS VISIT:  Medications  sodium chloride flush (NS) 0.9 % injection 3 mL (has no administration in time range)  ipratropium-albuterol (DUONEB) 0.5-2.5 (3) MG/3ML nebulizer solution 3 mL (has no administration in time range)  ketorolac (TORADOL) 30 MG/ML injection 30 mg (has no administration in time range)  predniSONE (DELTASONE) tablet 60 mg (has no administration in time range)     NEW OUTPATIENT MEDICATIONS STARTED DURING THIS VISIT:  New Prescriptions   No medications on file    Note:  This note was prepared with assistance of Dragon voice recognition software. Occasional wrong-word or sound-a-like substitutions may have occurred due to the inherent limitations of voice recognition software.   Merrily Pew, MD 04/29/18  2237

## 2018-04-27 NOTE — ED Notes (Signed)
EKG given to Dr Alvino Chapel

## 2018-04-27 NOTE — ED Triage Notes (Signed)
Patient c/o intermittent central sharp chest pain x1 week with tingling in right arm.

## 2018-04-27 NOTE — ED Notes (Signed)
Patient has extra tubes of blood in the main lab.  One blue and one gold

## 2018-04-28 LAB — TROPONIN I: Troponin I: <0.03 ng/mL (ref <0.03)

## 2018-04-28 MED ORDER — PREDNISONE 20 MG PO TABS: ORAL_TABLET | ORAL | 0 refills | Status: DC

## 2018-06-12 ENCOUNTER — Encounter (HOSPITAL_COMMUNITY): Payer: Self-pay | Admitting: Emergency Medicine

## 2018-06-12 ENCOUNTER — Other Ambulatory Visit: Payer: Self-pay

## 2018-06-12 ENCOUNTER — Emergency Department (HOSPITAL_COMMUNITY)
Admission: EM | Admit: 2018-06-12 | Discharge: 2018-06-12 | Disposition: A | Discharge status: Home / Self Care | Payer: BC Managed Care – PPO | Attending: Emergency Medicine | Admitting: Emergency Medicine

## 2018-06-12 DIAGNOSIS — J45909 Unspecified asthma, uncomplicated: Secondary | ICD-10-CM | POA: Diagnosis not present

## 2018-06-12 DIAGNOSIS — Z79899 Other long term (current) drug therapy: Secondary | ICD-10-CM | POA: Insufficient documentation

## 2018-06-12 DIAGNOSIS — M545 Low back pain: Secondary | ICD-10-CM | POA: Diagnosis present

## 2018-06-12 DIAGNOSIS — X500XXA Overexertion from strenuous movement or load, initial encounter: Secondary | ICD-10-CM | POA: Insufficient documentation

## 2018-06-12 DIAGNOSIS — S39012A Strain of muscle, fascia and tendon of lower back, initial encounter: Secondary | ICD-10-CM | POA: Diagnosis not present

## 2018-06-12 DIAGNOSIS — Y9389 Activity, other specified: Secondary | ICD-10-CM | POA: Insufficient documentation

## 2018-06-12 DIAGNOSIS — Y999 Unspecified external cause status: Secondary | ICD-10-CM | POA: Insufficient documentation

## 2018-06-12 DIAGNOSIS — Y9289 Other specified places as the place of occurrence of the external cause: Secondary | ICD-10-CM | POA: Insufficient documentation

## 2018-06-12 DIAGNOSIS — I10 Essential (primary) hypertension: Secondary | ICD-10-CM | POA: Diagnosis not present

## 2018-06-12 MED ORDER — IBUPROFEN 800 MG PO TABS: 800.0000 mg | ORAL_TABLET | Freq: Three times a day (TID) | ORAL | 0 refills | Status: DC

## 2018-06-12 MED ORDER — IBUPROFEN 800 MG PO TABS: 800.0000 mg | ORAL_TABLET | Freq: Three times a day (TID) | ORAL | 0 refills | Status: AC

## 2018-06-12 MED ORDER — PREDNISONE 10 MG PO TABS: 40.0000 mg | ORAL_TABLET | Freq: Every day | ORAL | 0 refills | Status: DC

## 2018-06-12 MED ORDER — KETOROLAC TROMETHAMINE 60 MG/2ML IM SOLN
60.0000 mg | Freq: Once | INTRAMUSCULAR | Status: AC
  Administered 2018-06-12: 60 mg via INTRAMUSCULAR
  Filled 2018-06-12: qty 2

## 2018-06-12 MED ORDER — METHOCARBAMOL 500 MG PO TABS: 1000.0000 mg | ORAL_TABLET | Freq: Four times a day (QID) | ORAL | 0 refills | Status: DC | PRN

## 2018-06-12 MED ORDER — METHOCARBAMOL 500 MG PO TABS: 1000.0000 mg | ORAL_TABLET | Freq: Four times a day (QID) | ORAL | 0 refills | Status: AC | PRN

## 2018-06-12 NOTE — ED Provider Notes (Signed)
[facility] DEPT Provider Note   CSN: [id number] Arrival date & time: 06/12/18  0754    History   Chief Complaint Chief Complaint  Patient presents with  . Back Pain    HPI Nicholas Moss is a 48 y.o. male.     The history is provided by the patient and medical records. No language interpreter was used.  Back Pain   Nicholas Moss is a 48 y.o. male who presents to the Emergency Department complaining of back pain. Since to the emergency department complaining of three days of low back pain. 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 Pain is located across his entire low back and is worse with bending, twisting and movement. He has tried to Advil daily as well as previously prescribed tizanidine and Flexeril with no significant improvement in his symptoms. He denies any fevers, abdominal pain, nausea, vomiting, dysuria, hematuria, numbness, weakness. He has a history of multiple similar episodes in the past and he thinks his job may be contributing to the symptoms. Overall symptoms are worsening. Past Medical History:  Diagnosis Date  . Asthma   . Eczema   . Hypertension     There are no active problems to display for this patient.   History reviewed. No pertinent surgical history.      Home Medications    Prior to Admission medications   Medication Sig Start Date End Date Taking? Authorizing Provider  budesonide-formoterol (SYMBICORT) 160-4.5 MCG/ACT inhaler Inhale 2 puffs into the lungs 2 (two) times daily.    [provider]  Diclofenac Sodium (PENNSAID) 2 % SOLN Place 2 Bottles onto the skin 2 (two) times daily as needed.    [provider]  ibuprofen (ADVIL,MOTRIN) 800 MG tablet Take 1 tablet (800 mg total) by mouth 3 (three) times daily. 06/12/18   Quintella Reichert, MD  methocarbamol (ROBAXIN) 500 MG tablet Take 2 tablets (1,000 mg total) by mouth every 6 (six) hours as needed for  muscle spasms. 06/12/18   Quintella Reichert, MD  predniSONE (DELTASONE) 10 MG tablet Take 4 tablets (40 mg total) by mouth daily. 06/12/18   Quintella Reichert, MD  triamterene-hydrochlorothiazide (MAXZIDE-25) 37.5-25 MG tablet Take 1 tablet by mouth daily.    [provider]    Family History No family history on file.  Social History Social History   Tobacco Use  . Smoking status: Never Smoker  . Smokeless tobacco: Never Used  Substance Use Topics  . Alcohol use: No  . Drug use: No     Allergies   Patient has no known allergies.   Review of Systems Review of Systems  Musculoskeletal: Positive for back pain.  All other systems reviewed and are negative.    Physical Exam Updated Vital Signs BP 125/81 (BP Location: Left Arm)   Pulse 77   Temp 98.1 F (36.7 C) (Oral)   Resp 17   SpO2 98%   Physical Exam Vitals signs and nursing note reviewed.  Constitutional:      Appearance: He is well-developed.  HENT:     Head: Normocephalic and atraumatic.  Cardiovascular:     Rate and Rhythm: Normal rate and regular rhythm.     Heart sounds: No murmur.  Pulmonary:     Effort: Pulmonary effort is normal. No respiratory distress.     Breath sounds: Normal breath sounds.  Abdominal:     Palpations: Abdomen is soft.  Tenderness: There is no abdominal tenderness. There is no guarding or rebound.  Musculoskeletal:     Comments: 2+ DP pulses bilaterally. Mild generalized lower lumbar tenderness without discrete bony tenderness. Pain is re-created with twisting, standing up in the stretcher.  Skin:    General: Skin is warm and dry.  Neurological:     Mental Status: He is alert and oriented to person, place, and time.     Comments: Five out of five strength in bilateral lower extremities with sensation to light touch throughout bilateral lower extremities.  Psychiatric:        Behavior: Behavior normal.      ED Treatments / Results  Labs (all labs ordered are listed,  but only abnormal results are displayed) Labs Reviewed - No data to display  EKG None  Radiology No results found.  Procedures Procedures (including critical care time)  Medications Ordered in ED Medications  ketorolac (TORADOL) injection 60 mg (has no administration in time range)     Initial Impression / Assessment and Plan / ED Course  I have reviewed the triage vital signs and the nursing notes.  Pertinent labs & imaging results that were available during my care of the patient were reviewed by me and considered in my medical decision making (see chart for details).        Patient with history of low back pain here for evaluation of recurrent low back pain described as a muscle spasm. He is neurovascularly intact on examination. Counseled patient on home care for muscle spasm, low back strain. Discussed outpatient follow-up and return precautions.  Presentation is not consistent with AAA, dissection, renal colic, cauda equina.  Final Clinical Impressions(s) / ED Diagnoses   Final diagnoses:  Strain of lumbar region, initial encounter    ED Discharge Orders         Ordered    predniSONE (DELTASONE) 10 MG tablet  Daily     06/12/18 0822    ibuprofen (ADVIL,MOTRIN) 800 MG tablet  3 times daily     06/12/18 0822    methocarbamol (ROBAXIN) 500 MG tablet  Every 6 hours PRN     06/12/18 2800           Quintella Reichert, MD 06/12/18 [phone]

## 2018-06-12 NOTE — ED Notes (Signed)
Extensive medication teaching provided to patient. Also discussed pain management for back pain with patient.

## 2018-06-12 NOTE — ED Triage Notes (Signed)
Pt c/o lower back pain and spasms that started Thursday night while he was at work. Denies any falls or injuries.

## 2019-02-16 ENCOUNTER — Other Ambulatory Visit: Payer: Self-pay

## 2019-02-16 ENCOUNTER — Emergency Department (HOSPITAL_COMMUNITY)
Admission: EM | Admit: 2019-02-16 | Discharge: 2019-02-16 | Disposition: A | Discharge status: Home / Self Care | Payer: 59 | Attending: Emergency Medicine | Admitting: Emergency Medicine

## 2019-02-16 ENCOUNTER — Emergency Department (HOSPITAL_COMMUNITY): Payer: 59

## 2019-02-16 ENCOUNTER — Encounter (HOSPITAL_COMMUNITY): Payer: Self-pay

## 2019-02-16 DIAGNOSIS — J45909 Unspecified asthma, uncomplicated: Secondary | ICD-10-CM | POA: Insufficient documentation

## 2019-02-16 DIAGNOSIS — Z79899 Other long term (current) drug therapy: Secondary | ICD-10-CM | POA: Insufficient documentation

## 2019-02-16 DIAGNOSIS — Z791 Long term (current) use of non-steroidal anti-inflammatories (NSAID): Secondary | ICD-10-CM | POA: Insufficient documentation

## 2019-02-16 DIAGNOSIS — I10 Essential (primary) hypertension: Secondary | ICD-10-CM | POA: Diagnosis not present

## 2019-02-16 DIAGNOSIS — M79671 Pain in right foot: Secondary | ICD-10-CM | POA: Insufficient documentation

## 2019-02-16 MED ORDER — NAPROXEN 500 MG PO TABS
500.0000 mg | ORAL_TABLET | Freq: Once | ORAL | Status: AC
  Administered 2019-02-16: 500 mg via ORAL
  Filled 2019-02-16: qty 1

## 2019-02-16 MED ORDER — NAPROXEN 500 MG PO TABS: 500.0000 mg | ORAL_TABLET | Freq: Two times a day (BID) | ORAL | 0 refills | Status: DC

## 2019-02-16 NOTE — Discharge Instructions (Signed)
Please read and follow all provided instructions.  Your diagnoses today include:  1. Right foot pain     Tests performed today include:  An x-ray of the affected area - does NOT show any broken bones  Vital signs. See below for your results today.   Medications prescribed:   Naproxen - anti-inflammatory pain medication  Do not exceed 522m naproxen every 12 hours, take with food  You have been prescribed an anti-inflammatory medication or NSAID. Take with food. Take smallest effective dose for the shortest duration needed for your pain. Stop taking if you experience stomach pain or vomiting.   Take any prescribed medications only as directed.  Home care instructions:   Follow any educational materials contained in this packet  Follow R.I.C.E. Protocol:  R - rest your injury   I  - use ice on injury without applying directly to skin  C - compress injury with bandage or splint  E - elevate the injury as much as possible  Follow-up instructions: Please follow-up with your primary care provider or the provided podiatrist if you continue to have significant pain in 1 week.   Return instructions:   Please return if your toes or feet are numb or tingling, appear gray or blue, or you have severe pain (also elevate the leg and loosen splint or wrap if you were given one)  Please return to the Emergency Department if you experience worsening symptoms.   Please return if you have any other emergent concerns.  Additional Information:  Your vital signs today were: BP (!) 131/104 (BP Location: Right Arm)    Pulse 69    Temp 98.4 F (36.9 C) (Oral)    Resp 16    SpO2 98%  If your blood pressure (BP) was elevated above 135/85 this visit, please have this repeated by your doctor within one month. --------------

## 2019-02-16 NOTE — ED Provider Notes (Signed)
[facility] DEPT Provider Note   CSN: [id number] Arrival date & time: 02/16/19  1055     History   Chief Complaint Chief Complaint  Patient presents with  . Foot Pain    HPI Nicholas Moss is a 48 y.o. male.     Patient presents the emergency department today with complaint of pain on the bottom of the foot as well as the dorsum of his foot starting gradually yesterday.  Patient works multiple jobs and is on his feet a lot.  Pain is sharp in nature and worse with palpation in certain areas as well as movement and bearing weight.  No ankle or knee pain.  Patient has taken Advil and applied lidocaine topically without much improvement.  No numbness or tingling.  No history of diabetes.  No history of gout.     Past Medical History:  Diagnosis Date  . Asthma   . Eczema   . Hypertension     There are no active problems to display for this patient.   History reviewed. No pertinent surgical history.      Home Medications    Prior to Admission medications   Medication Sig Start Date End Date Taking? Authorizing Provider  budesonide-formoterol (SYMBICORT) 160-4.5 MCG/ACT inhaler Inhale 2 puffs into the lungs 2 (two) times daily.    [provider]  Diclofenac Sodium (PENNSAID) 2 % SOLN Place 2 Bottles onto the skin 2 (two) times daily as needed.    [provider]  ibuprofen (ADVIL,MOTRIN) 800 MG tablet Take 1 tablet (800 mg total) by mouth 3 (three) times daily. 06/12/18   Quintella Reichert, MD  methocarbamol (ROBAXIN) 500 MG tablet Take 2 tablets (1,000 mg total) by mouth every 6 (six) hours as needed for muscle spasms. 06/12/18   Quintella Reichert, MD  naproxen (NAPROSYN) 500 MG tablet Take 1 tablet (500 mg total) by mouth 2 (two) times daily. 02/16/19   Carlisle Cater, PA-C  predniSONE (DELTASONE) 10 MG tablet Take 4 tablets (40 mg total) by mouth daily. 06/12/18   Quintella Reichert, MD  triamterene-hydrochlorothiazide (MAXZIDE-25)  37.5-25 MG tablet Take 1 tablet by mouth daily.    [provider]    Family History History reviewed. No pertinent family history.  Social History Social History   Tobacco Use  . Smoking status: Never Smoker  . Smokeless tobacco: Never Used  Substance Use Topics  . Alcohol use: No  . Drug use: No     Allergies   Patient has no known allergies.   Review of Systems Review of Systems  Constitutional: Negative for activity change.  Musculoskeletal: Positive for arthralgias and joint swelling. Negative for back pain, gait problem and neck pain.  Skin: Negative for wound.  Neurological: Negative for weakness and numbness.     Physical Exam Updated Vital Signs BP (!) 131/104 (BP Location: Right Arm)   Pulse 69   Temp 98.4 F (36.9 C) (Oral)   Resp 16   SpO2 98%   Physical Exam Vitals signs and nursing note reviewed.  Constitutional:      Appearance: He is well-developed.  HENT:     Head: Normocephalic and atraumatic.  Eyes:     Conjunctiva/sclera: Conjunctivae normal.  Neck:     Musculoskeletal: Normal range of motion and neck supple.  Cardiovascular:     Pulses: Normal pulses. No decreased pulses.          Dorsalis pedis pulses are 2+ on the right side.  Musculoskeletal:  General: Tenderness present.     Right knee: Normal.     Right ankle: Normal.     Right foot: Decreased range of motion. Tenderness and bony tenderness present.  Feet:     Right foot:     Toenail Condition: Right toenails are abnormally thick. Fungal disease present. Skin:    General: Skin is warm and dry.  Neurological:     Mental Status: He is alert.     Sensory: No sensory deficit.     Comments: Motor, sensation, and vascular distal to the injury is fully intact.       ED Treatments / Results  Labs (all labs ordered are listed, but only abnormal results are displayed) Labs Reviewed - No data to display  EKG None  Radiology Dg Foot Complete Right  Result  Date: 02/16/2019 CLINICAL DATA:  Right foot pain starting last night.  No injury. EXAM: RIGHT FOOT COMPLETE - 3+ VIEW COMPARISON:  None. FINDINGS: There is no evidence of fracture or dislocation. There is no evidence of arthropathy or other focal bone abnormality. Soft tissues are unremarkable. IMPRESSION: Negative. Electronically Signed   By: Abelardo Diesel M.D.   On: 02/16/2019 13:52    Procedures Procedures (including critical care time)  Medications Ordered in ED Medications  naproxen (NAPROSYN) tablet 500 mg (500 mg Oral Given 02/16/19 1309)     Initial Impression / Assessment and Plan / ED Course  I have reviewed the triage vital signs and the nursing notes.  Pertinent labs & imaging results that were available during my care of the patient were reviewed by me and considered in my medical decision making (see chart for details).        Patient seen and examined. Work-up initiated. Medications ordered.   Vital signs reviewed and are as follows: BP (!) 131/104 (BP Location: Right Arm)   Pulse 69   Temp 98.4 F (36.9 C) (Oral)   Resp 16   SpO2 98%   Reviewed x-ray results with patient.  Discussed rice protocol, stretching.  Podiatry follow-up given.  Encouraged return with worsening.  Final Clinical Impressions(s) / ED Diagnoses   Final diagnoses:  Right foot pain   Patient with minor soft tissue swelling and pain in the right foot.  Somewhat consistent with plantar fasciitis.  Patient does have some pain over the dorsum of the foot as well.  X-ray negative for fracture.  No signs of infection.  ED Discharge Orders         Ordered    naproxen (NAPROSYN) 500 MG tablet  2 times daily     02/16/19 1458           Carlisle Cater, PA-C 02/16/19 1505    Virgel Manifold, MD 02/17/19 [phone]

## 2019-02-16 NOTE — ED Triage Notes (Signed)
Pt presents with c/o right foot pain that started last night. Pt denies any injury to that foot. Pt reports the pain is most prominent on the ball of his foot.

## 2019-11-23 ENCOUNTER — Other Ambulatory Visit: Payer: Self-pay

## 2019-11-23 ENCOUNTER — Other Ambulatory Visit: Payer: Self-pay | Admitting: Family Medicine

## 2019-11-23 ENCOUNTER — Ambulatory Visit: Payer: Self-pay

## 2019-11-23 DIAGNOSIS — M79641 Pain in right hand: Secondary | ICD-10-CM

## 2019-12-09 LAB — EXTERNAL GENERIC LAB PROCEDURE: COLOGUARD: NEGATIVE

## 2020-08-17 ENCOUNTER — Emergency Department (HOSPITAL_COMMUNITY)
Admission: EM | Admit: 2020-08-17 | Discharge: 2020-08-17 | Disposition: A | Discharge status: Home / Self Care | Payer: PRIVATE HEALTH INSURANCE | Attending: Emergency Medicine | Admitting: Emergency Medicine

## 2020-08-17 ENCOUNTER — Other Ambulatory Visit: Payer: Self-pay

## 2020-08-17 ENCOUNTER — Emergency Department (HOSPITAL_COMMUNITY): Payer: PRIVATE HEALTH INSURANCE

## 2020-08-17 ENCOUNTER — Encounter (HOSPITAL_COMMUNITY): Payer: Self-pay

## 2020-08-17 DIAGNOSIS — U071 COVID-19: Secondary | ICD-10-CM | POA: Diagnosis not present

## 2020-08-17 DIAGNOSIS — J3489 Other specified disorders of nose and nasal sinuses: Secondary | ICD-10-CM | POA: Insufficient documentation

## 2020-08-17 DIAGNOSIS — Z7951 Long term (current) use of inhaled steroids: Secondary | ICD-10-CM | POA: Diagnosis not present

## 2020-08-17 DIAGNOSIS — J45998 Other asthma: Secondary | ICD-10-CM | POA: Insufficient documentation

## 2020-08-17 DIAGNOSIS — I10 Essential (primary) hypertension: Secondary | ICD-10-CM | POA: Diagnosis not present

## 2020-08-17 DIAGNOSIS — J45901 Unspecified asthma with (acute) exacerbation: Secondary | ICD-10-CM

## 2020-08-17 DIAGNOSIS — Z79899 Other long term (current) drug therapy: Secondary | ICD-10-CM | POA: Diagnosis not present

## 2020-08-17 DIAGNOSIS — R059 Cough, unspecified: Secondary | ICD-10-CM | POA: Diagnosis present

## 2020-08-17 LAB — COMPREHENSIVE METABOLIC PANEL
ALT: 45 U/L — ABNORMAL HIGH (ref 0–44)
AST: 40 U/L (ref 15–41)
Albumin: 3.9 g/dL (ref 3.5–5.0)
Alkaline Phosphatase: 70 U/L (ref 38–126)
Anion gap: 9 (ref 5–15)
BUN: 14 mg/dL (ref 6–20)
CO2: 25 mmol/L (ref 22–32)
Calcium: 9.1 mg/dL (ref 8.9–10.3)
Chloride: 101 mmol/L (ref 98–111)
Creatinine, Ser: 1.19 mg/dL (ref 0.61–1.24)
GFR, Estimated: >60 mL/min (ref >60)
Glucose, Bld: 90 mg/dL (ref 70–99)
Potassium: 3.6 mmol/L (ref 3.5–5.1)
Sodium: 135 mmol/L (ref 135–145)
Total Bilirubin: 0.9 mg/dL (ref 0.3–1.2)
Total Protein: 7.9 g/dL (ref 6.5–8.1)

## 2020-08-17 LAB — CBC
HCT: 50 % (ref 39.0–52.0)
Hemoglobin: 16.6 g/dL (ref 13.0–17.0)
MCH: 30.1 pg (ref 26.0–34.0)
MCHC: 33.2 g/dL (ref 30.0–36.0)
MCV: 90.6 fL (ref 80.0–100.0)
Platelets: 222 10*3/uL (ref 150–400)
RBC: 5.52 MIL/uL (ref 4.22–5.81)
RDW: 12 % (ref 11.5–15.5)
WBC: 4.5 10*3/uL (ref 4.0–10.5)
nRBC: 0 % (ref 0.0–0.2)

## 2020-08-17 LAB — RESP PANEL BY RT-PCR (FLU A&B, COVID) ARPGX2
Influenza A by PCR: NEGATIVE
Influenza B by PCR: NEGATIVE
SARS Coronavirus 2 by RT PCR: POSITIVE — AB

## 2020-08-17 MED ORDER — MOLNUPIRAVIR EUA 200MG CAPSULE: 4.0000 | ORAL_CAPSULE | Freq: Two times a day (BID) | ORAL | 0 refills | Status: AC

## 2020-08-17 MED ORDER — PREDNISONE 10 MG PO TABS: 20.0000 mg | ORAL_TABLET | Freq: Every day | ORAL | 0 refills | Status: DC

## 2020-08-17 MED ORDER — ALBUTEROL SULFATE HFA 108 (90 BASE) MCG/ACT IN AERS
2.0000 | INHALATION_SPRAY | RESPIRATORY_TRACT | Status: DC | PRN
  Administered 2020-08-17: 2 via RESPIRATORY_TRACT
  Filled 2020-08-17: qty 6.7

## 2020-08-17 MED ORDER — METHYLPREDNISOLONE SODIUM SUCC 125 MG IJ SOLR
125.0000 mg | Freq: Once | INTRAMUSCULAR | Status: AC
  Administered 2020-08-17: 125 mg via INTRAVENOUS
  Filled 2020-08-17: qty 2

## 2020-08-17 NOTE — ED Triage Notes (Signed)
Coming from home, asthma exacerbation since yesterday, productive cough with minimal sputum, takes symbicort and albuterol at home, hasn't helped much

## 2020-08-17 NOTE — Discharge Instructions (Addendum)
Please pick up your COVID medication prescription today at the [facility] on [address].  You should start this as soon as possible. Continue your prednisone and your inhaler. Return to the emergency department if you are getting short of breath or worsening in any other way.

## 2020-08-17 NOTE — ED Provider Notes (Signed)
[facility] DEPT Provider Note   CSN: [id number] Arrival date & time: 08/17/20  5974     History Chief Complaint  Patient presents with  . Asthma    Nicholas Moss is a 50 y.o. male.  HPI 50 yo male ho asthma presents today with 36 hours of URI symptoms.  He states he began having congestion on Thursday evening.  Yesterday he began having cough.  He has been nonproductive.  He has a history of asthma.  He has had wheezing with this.  He uses Symbicort twice a day.  He has been using his albuterol rescue inhaler every 4 hours.  He has both an inhaler and nebulizer.  He reports some improvement of dyspnea but continues to feel out of breath.  He denies fever, chills, nausea, vomiting.  He reports that he has had both of his COVID vaccines and boosters.  He does not have any known exposures.  He reports that his girlfriend has had similar symptoms that preceded his.    Past Medical History:  Diagnosis Date  . Asthma   . Eczema   . Hypertension     There are no problems to display for this patient.   Past Surgical History:  Procedure Laterality Date  . VASECTOMY         History reviewed. No pertinent family history.  Social History   Tobacco Use  . Smoking status: Never Smoker  . Smokeless tobacco: Never Used  Vaping Use  . Vaping Use: Never used  Substance Use Topics  . Alcohol use: No  . Drug use: No    Home Medications Prior to Admission medications   Medication Sig Start Date End Date Taking? Authorizing Provider  budesonide-formoterol (SYMBICORT) 160-4.5 MCG/ACT inhaler Inhale 2 puffs into the lungs 2 (two) times daily.    [provider]  Diclofenac Sodium (PENNSAID) 2 % SOLN Place 2 Bottles onto the skin 2 (two) times daily as needed.    [provider]  ibuprofen (ADVIL,MOTRIN) 800 MG tablet Take 1 tablet (800 mg total) by mouth 3 (three) times daily. 06/12/18   Quintella Reichert, MD  methocarbamol (ROBAXIN) 500  MG tablet Take 2 tablets (1,000 mg total) by mouth every 6 (six) hours as needed for muscle spasms. 06/12/18   Quintella Reichert, MD  naproxen (NAPROSYN) 500 MG tablet Take 1 tablet (500 mg total) by mouth 2 (two) times daily. 02/16/19   Carlisle Cater, PA-C  predniSONE (DELTASONE) 10 MG tablet Take 4 tablets (40 mg total) by mouth daily. 06/12/18   Quintella Reichert, MD  triamterene-hydrochlorothiazide (MAXZIDE-25) 37.5-25 MG tablet Take 1 tablet by mouth daily.    [provider]    Allergies    Patient has no known allergies.  Review of Systems   Review of Systems  All other systems reviewed and are negative.   Physical Exam Updated Vital Signs BP (!) 119/91   Pulse 84   Temp 98.7 F (37.1 C) (Oral)   Resp 18   SpO2 96%   Physical Exam Vitals reviewed.  Constitutional:      General: He is not in acute distress.    Appearance: Normal appearance. He is ill-appearing.  HENT:     Head: Normocephalic.     Right Ear: External ear normal.     Left Ear: External ear normal.     Nose: Nose normal.     Mouth/Throat:     Pharynx: Oropharynx is clear.  Eyes:  Pupils: Pupils are equal, round, and reactive to light.  Cardiovascular:     Rate and Rhythm: Normal rate and regular rhythm.     Pulses: Normal pulses.  Pulmonary:     Effort: Pulmonary effort is normal.     Breath sounds: Wheezing and rhonchi present.     Comments: Scattered expiratory wheezes bilateral bases few scattered rhonchi Abdominal:     General: Abdomen is flat.     Palpations: Abdomen is soft.  Musculoskeletal:        General: Normal range of motion.     Cervical back: Normal range of motion.  Skin:    General: Skin is warm and dry.     Capillary Refill: Capillary refill takes less than 2 seconds.  Neurological:     General: No focal deficit present.     Mental Status: He is alert.  Psychiatric:        Mood and Affect: Mood normal.     ED Results / Procedures / Treatments   Labs (all labs  ordered are listed, but only abnormal results are displayed) Labs Reviewed  RESP PANEL BY RT-PCR (FLU A&B, COVID) ARPGX2 - Abnormal; Notable for the following components:      Result Value   SARS Coronavirus 2 by RT PCR POSITIVE (*)    All other components within normal limits  COMPREHENSIVE METABOLIC PANEL - Abnormal; Notable for the following components:   ALT 45 (*)    All other components within normal limits  CBC    EKG None  Radiology DG Chest 2 View  Result Date: 08/17/2020 CLINICAL DATA:  Pt came from home for an asthma exacerbation since Thursday with a dry cough with minimal sputum. Pt stated he takes symbicort and albuterol at home but it hasn't helped much. EXAM: CHEST - 2 VIEW COMPARISON:  04/27/2018. FINDINGS: The heart size and mediastinal contours are within normal limits. Both lungs are clear. No pleural effusion or pneumothorax. The visualized skeletal structures are unremarkable. IMPRESSION: No active cardiopulmonary disease. Electronically Signed   By: Lajean Manes M.D.   On: 08/17/2020 10:49    Procedures Procedures   Medications Ordered in ED Medications  albuterol (VENTOLIN HFA) 108 (90 Base) MCG/ACT inhaler 2 puff (2 puffs Inhalation Given 08/17/20 1116)  methylPREDNISolone sodium succinate (SOLU-MEDROL) 125 mg/2 mL injection 125 mg (125 mg Intravenous Given 08/17/20 1116)    ED Course  I have reviewed the triage vital signs and the nursing notes.  Pertinent labs & imaging results that were available during my care of the patient were reviewed by me and considered in my medical decision making (see chart for details).    MDM Rules/Calculators/A&P                          Treated here in the ED with albuterol and Solu-Medrol.  He feels somewhat improved.  COVID test is positive.  Reviewed algorithm and patient be given prescription for molnupiravir.  He will also be placed on prednisone given his underlying asthma.  He is not currently wheezing here in the  ED.  He reports he has plenty of albuterol at home. His oxygen saturations are 96%.  Heart rate is 84.  Blood pressure is 119/90.  He is afebrile. We have discussed return precautions.  Patient will be referred to [facility] outpatient. Final Clinical Impression(s) / ED Diagnoses Final diagnoses:  Moderate asthma with exacerbation, unspecified whether persistent  COVID  Rx / DC Orders ED Discharge Orders         Ordered    Ambulatory referral for Covid Treatment        08/17/20 1359    molnupiravir EUA 200 mg CAPS  2 times daily        08/17/20 1400    predniSONE (DELTASONE) 10 MG tablet  Daily        08/17/20 1401           Pattricia Boss, MD 08/17/20 1402

## 2020-08-18 ENCOUNTER — Other Ambulatory Visit: Payer: Self-pay | Admitting: Oncology

## 2020-08-18 NOTE — Progress Notes (Signed)
Called to discuss with patient about COVID-19 symptoms and the use of one of the available treatments for those with mild to moderate Covid symptoms and at a high risk of hospitalization.  Pt appears to qualify for outpatient treatment due to co-morbid conditions and/or a member of an at-risk group in accordance with the FDA Emergency Use Authorization.    Symptom onset: 08/16/20 Vaccinated: yes Booster? yes Immunocompromised? No  Qualifiers:  Past Medical History:  Diagnosis Date  . Asthma   . Eczema   . Hypertension     Unable to reach pt - Left VM.   Jacquelin Hawking

## 2020-08-19 NOTE — ED Notes (Signed)
Opened cart at pts request to assist with looking into My Chart for his + Covid results.

## 2021-06-16 ENCOUNTER — Encounter (HOSPITAL_COMMUNITY): Payer: Self-pay

## 2021-06-16 ENCOUNTER — Emergency Department (HOSPITAL_COMMUNITY)
Admission: EM | Admit: 2021-06-16 | Discharge: 2021-06-16 | Disposition: A | Discharge status: Home / Self Care | Payer: PRIVATE HEALTH INSURANCE | Attending: Emergency Medicine | Admitting: Emergency Medicine

## 2021-06-16 ENCOUNTER — Other Ambulatory Visit: Payer: Self-pay

## 2021-06-16 DIAGNOSIS — M62838 Other muscle spasm: Secondary | ICD-10-CM

## 2021-06-16 DIAGNOSIS — M62830 Muscle spasm of back: Secondary | ICD-10-CM | POA: Insufficient documentation

## 2021-06-16 MED ORDER — METHOCARBAMOL 500 MG PO TABS
750.0000 mg | ORAL_TABLET | Freq: Once | ORAL | Status: AC
  Administered 2021-06-16: 750 mg via ORAL
  Filled 2021-06-16: qty 2

## 2021-06-16 MED ORDER — CYCLOBENZAPRINE HCL 10 MG PO TABS: 10.0000 mg | ORAL_TABLET | Freq: Two times a day (BID) | ORAL | 0 refills | Status: DC | PRN

## 2021-06-16 MED ORDER — KETOROLAC TROMETHAMINE 30 MG/ML IJ SOLN
30.0000 mg | Freq: Once | INTRAMUSCULAR | Status: AC
  Administered 2021-06-16: 30 mg via INTRAMUSCULAR
  Filled 2021-06-16: qty 1

## 2021-06-16 MED ORDER — NAPROXEN 500 MG PO TABS: 500.0000 mg | ORAL_TABLET | Freq: Two times a day (BID) | ORAL | 0 refills | Status: DC

## 2021-06-16 NOTE — Discharge Instructions (Addendum)
I have refilled your medications for your muscle spasms.  It could take a week or 2 for your lower back pain to fully resolve.  If you develop numbness or tingling in the groin or bladder or bowel dysfunction, please return to the ED for evaluation.  Please avoid driving or operating heavy machinery after taking your muscle relaxer as it can cause drowsiness. ?

## 2021-06-16 NOTE — ED Triage Notes (Signed)
Patient states that he bought himself a new pull up bar and tried it out and afterwards he had bilateral lower back pain tht radiates into the posterior thighs. ?

## 2021-06-17 NOTE — ED Provider Notes (Signed)
?[facility] DEPT ?Provider Note ? ? ?CSN: [id number] ?Arrival date & time: 06/16/21  1112 ? ?  ? ?History ? ?Chief Complaint  ?Patient presents with  ? Back Pain  ? ? ?Nicholas Moss is a 51 y.o. male with history of lower back muscle spasms who presents to the ED for exacerbation of his lower back pain.  Patient states that he had a new pull-up bar and attempted to use it and the next day he woke up with bilateral lower back pain that radiates into the posterior thighs.  He has an expired prescription for Flexeril, tizanidine and naproxen that he feels are not helping and they usually do.  Pain is limiting his walking, and patient is unable to go to work to see a physically demanding job.  Patient is accompanied by girlfriend who requests that patient have an updated refill on his medications.  He denies chest pain, shortness of breath, saddle paresthesia, urinary and bowel dysfunction, numbness and tingling. ? ?Back Pain ? ?  ? ?Home Medications ?Prior to Admission medications   ?Medication Sig Start Date End Date Taking? Authorizing Provider  ?budesonide-formoterol (SYMBICORT) 160-4.5 MCG/ACT inhaler Inhale 2 puffs into the lungs 2 (two) times daily.    [provider]  ?cyclobenzaprine (FLEXERIL) 10 MG tablet Take 1 tablet (10 mg total) by mouth 2 (two) times daily as needed for muscle spasms. 06/16/21   Tonye Pearson, PA-C  ?Diclofenac Sodium (PENNSAID) 2 % SOLN Place 2 Bottles onto the skin 2 (two) times daily as needed.    [provider]  ?ibuprofen (ADVIL,MOTRIN) 800 MG tablet Take 1 tablet (800 mg total) by mouth 3 (three) times daily. 06/12/18   Quintella Reichert, MD  ?methocarbamol (ROBAXIN) 500 MG tablet Take 2 tablets (1,000 mg total) by mouth every 6 (six) hours as needed for muscle spasms. 06/12/18   Quintella Reichert, MD  ?naproxen (NAPROSYN) 500 MG tablet Take 1 tablet (500 mg total) by mouth 2 (two) times daily. 02/16/19   Carlisle Cater, PA-C  ?naproxen  (NAPROSYN) 500 MG tablet Take 1 tablet (500 mg total) by mouth 2 (two) times daily. 06/16/21   Tonye Pearson, PA-C  ?predniSONE (DELTASONE) 10 MG tablet Take 2 tablets (20 mg total) by mouth daily. 08/17/20   Pattricia Boss, MD  ?triamterene-hydrochlorothiazide (MAXZIDE-25) 37.5-25 MG tablet Take 1 tablet by mouth daily.    [provider]  ?   ? ?Allergies    ?Patient has no known allergies.   ? ?Review of Systems   ?Review of Systems  ?Musculoskeletal:  Positive for back pain.  ? ?Physical Exam ?Updated Vital Signs ?BP (!) 141/130 (BP Location: Left Arm)   Pulse (!) 103   Temp 98 ?F (36.7 ?C) (Oral)   Resp 18   Ht 5' 5"  (1.651 m)   Wt 78.9 kg   SpO2 97%   BMI 28.96 kg/m?  ?Physical Exam ?Vitals and nursing note reviewed.  ?Constitutional:   ?   General: He is not in acute distress. ?   Appearance: He is not ill-appearing.  ?HENT:  ?   Head: Atraumatic.  ?Eyes:  ?   Conjunctiva/sclera: Conjunctivae normal.  ?Cardiovascular:  ?   Rate and Rhythm: Normal rate and regular rhythm.  ?   Pulses: Normal pulses.  ?   Heart sounds: No murmur heard. ?Pulmonary:  ?   Effort: Pulmonary effort is normal. No respiratory distress.  ?   Breath sounds: Normal breath sounds.  ?Abdominal:  ?  General: Abdomen is flat. There is no distension.  ?   Palpations: Abdomen is soft.  ?   Tenderness: There is no abdominal tenderness.  ?Musculoskeletal:     ?   General: Normal range of motion.  ?   Cervical back: Normal range of motion.  ?   Comments: T-spine and L-spine nontender to palpation at midline. ?Patient moves all extremities without difficulty. ?All joints supple and easily movable, no erythema, swelling or palpable deformity, all compartments soft. ?Visible muscle spasm of the left lumbar region  ?Skin: ?   General: Skin is warm and dry.  ?   Capillary Refill: Capillary refill takes less than 2 seconds.  ?Neurological:  ?   General: No focal deficit present.  ?   Mental Status: He is alert.  ?Psychiatric:     ?    Mood and Affect: Mood normal.  ? ? ?ED Results / Procedures / Treatments   ?Labs ?(all labs ordered are listed, but only abnormal results are displayed) ?Labs Reviewed - No data to display ? ?EKG ?None ? ?Radiology ?No results found. ? ?Procedures ?Procedures  ? ?Medications Ordered in ED ?Medications  ?ketorolac (TORADOL) 30 MG/ML injection 30 mg (30 mg Intramuscular Given 06/16/21 1403)  ?methocarbamol (ROBAXIN) tablet 750 mg (750 mg Oral Given 06/16/21 1401)  ? ? ?ED Course/ Medical Decision Making/ A&P ?  ?                        ?Medical Decision Making ?Risk ?Prescription drug management. ? ? ?History:  ?Per HPI ?Social determinants of health: No health insurance ? ?Initial impression: ? ?This patient presents to the ED for concern of lower back pain, this involves an extensive number of treatment options, and is a complaint that carries with it a high risk of complications and morbidity.    ?Is an overall well-appearing although uncomfortable 51 year old man.  Nontoxic-appearing with normal vitals although slightly tachycardic, likely from discomfort from movement and being roomed.  There is a visible muscle spasm of the left lumbar region.  He is nontender to midline, negative paraspinal tenderness.  Patient was given Robaxin and Toradol here in the emergency department.  He requests refill of his medications and a work note and to be discharged.  I did consider getting imaging, however given patient's known history of spasms and physical exam findings, I do not think that this was clinically indicated. ? ? ?Medicines ordered and prescription drug management: ? ?I ordered medication including: ?Robaxin ?Toradol 30 mg IM ?Reevaluation of the patient after these medicines showed that the patient improved ?I have reviewed the patients home medicines and have made adjustments as needed ? ? ?Disposition: ? ?After consideration of the diagnostic results, physical exam, history and the patients response to treatment  feel that the patent would benefit from discharge with outpatient follow-up.   ?Muscle spasm: All questions were asked and answered.  At home supportive care measures were discussed.  I provided refills for Flexeril and naproxen.  Work note provided.  Patient was discharged home in good condition.  Return precautions were discussed. ? ? ?Final Clinical Impression(s) / ED Diagnoses ?Final diagnoses:  ?Muscle spasm  ? ? ?Rx / DC Orders ?ED Discharge Orders   ? ?      Ordered  ?  cyclobenzaprine (FLEXERIL) 10 MG tablet  2 times daily PRN,   Status:  Discontinued       ? 06/16/21 1415  ?  naproxen (NAPROSYN) 500 MG tablet  2 times daily,   Status:  Discontinued       ? 06/16/21 1415  ?  cyclobenzaprine (FLEXERIL) 10 MG tablet  2 times daily PRN       ? 06/16/21 1426  ?  naproxen (NAPROSYN) 500 MG tablet  2 times daily       ? 06/16/21 1426  ? ?  ?  ? ?  ? ? ?  ?Tonye Pearson, [place] ?06/17/21 1497 ? ?  ?Valarie Merino, MD ?06/18/21 [phone] ? ?

## 2021-07-31 IMAGING — CR DG CHEST 2V
2 series · 2 of 2 positions shown · non-contrast
Comparison: 04/27/2018.

CLINICAL DATA: Pt came from home for an asthma exacerbation since
[REDACTED] with a dry cough with minimal sputum. Pt stated he takes
symbicort and albuterol at home but it hasn't helped much.

EXAM:
CHEST - 2 VIEW

[w chest pa]
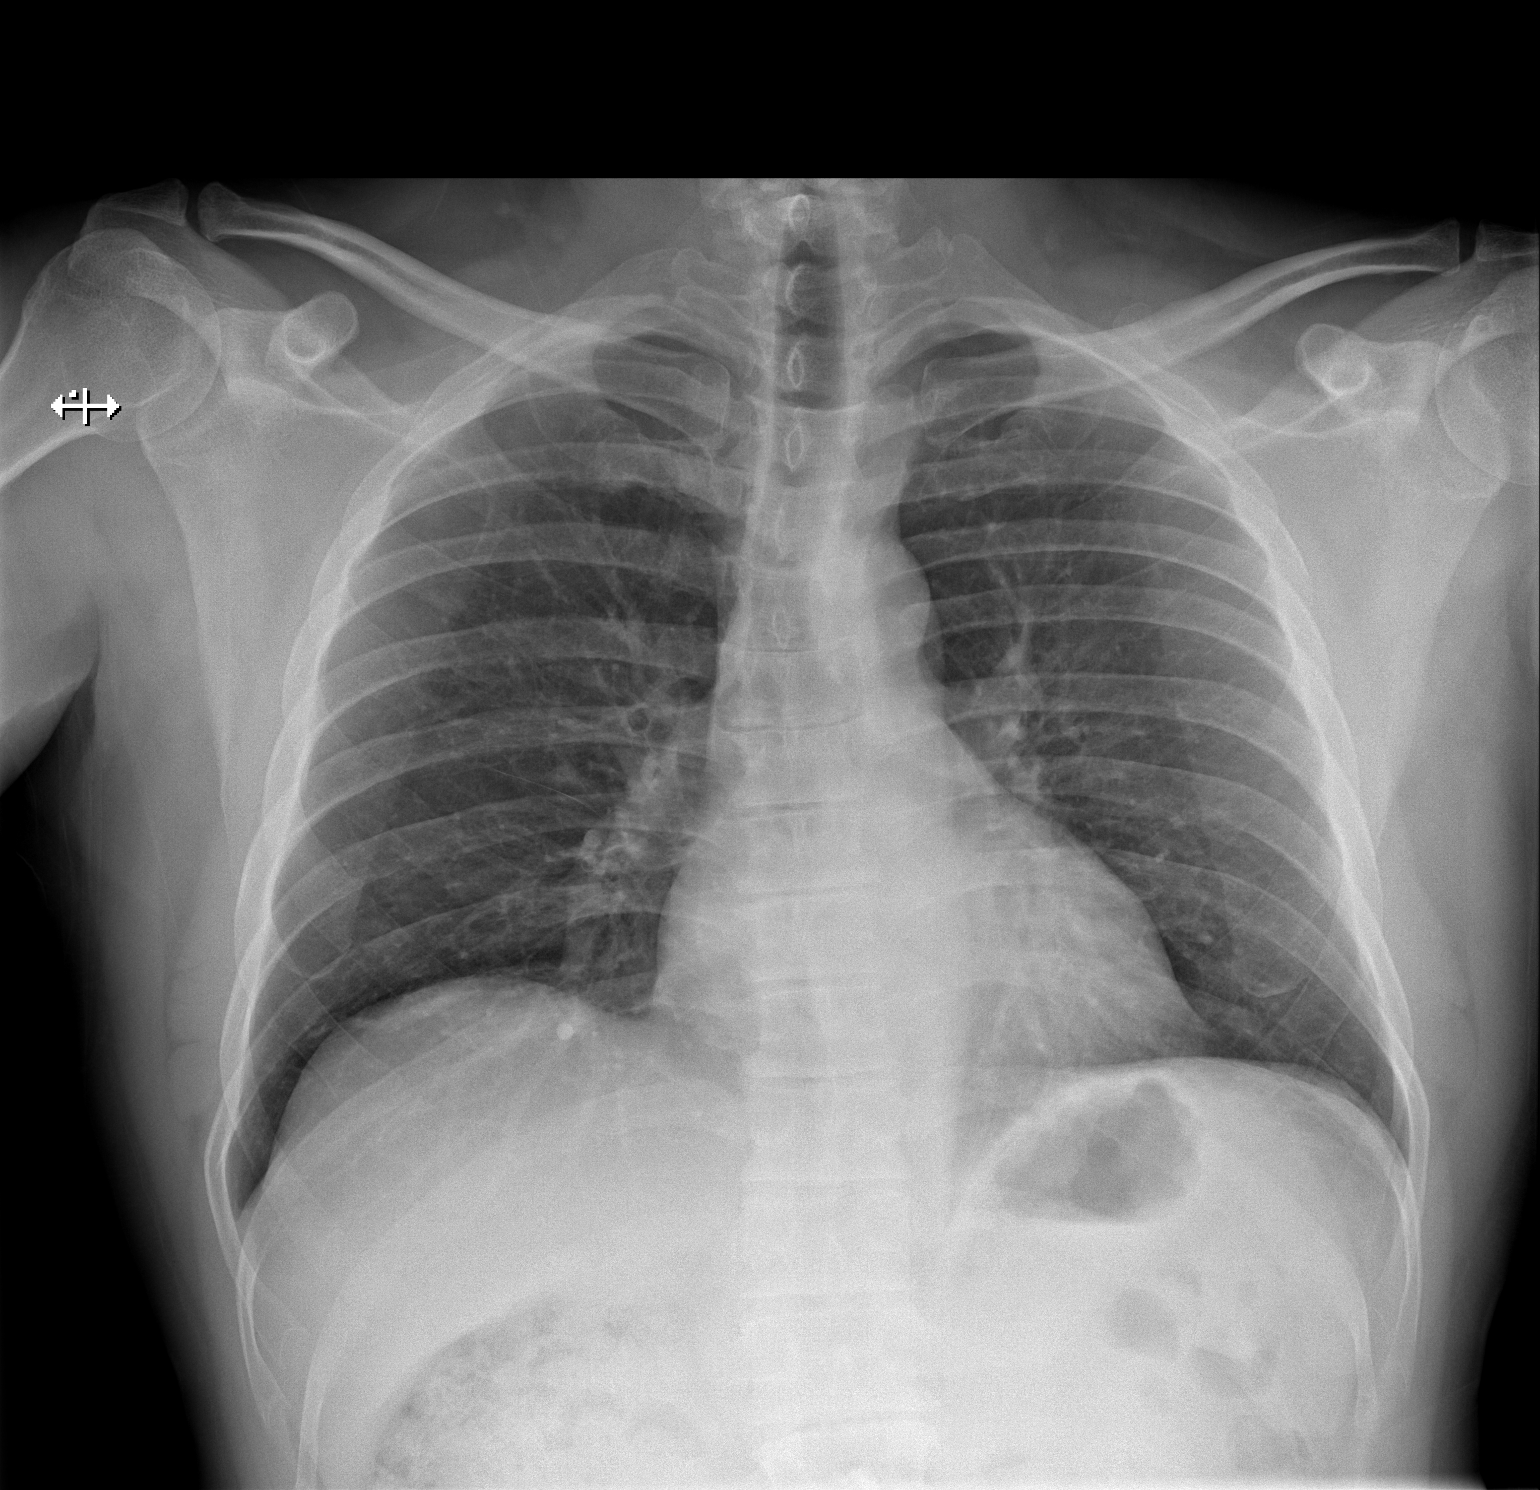

[w chest lat]
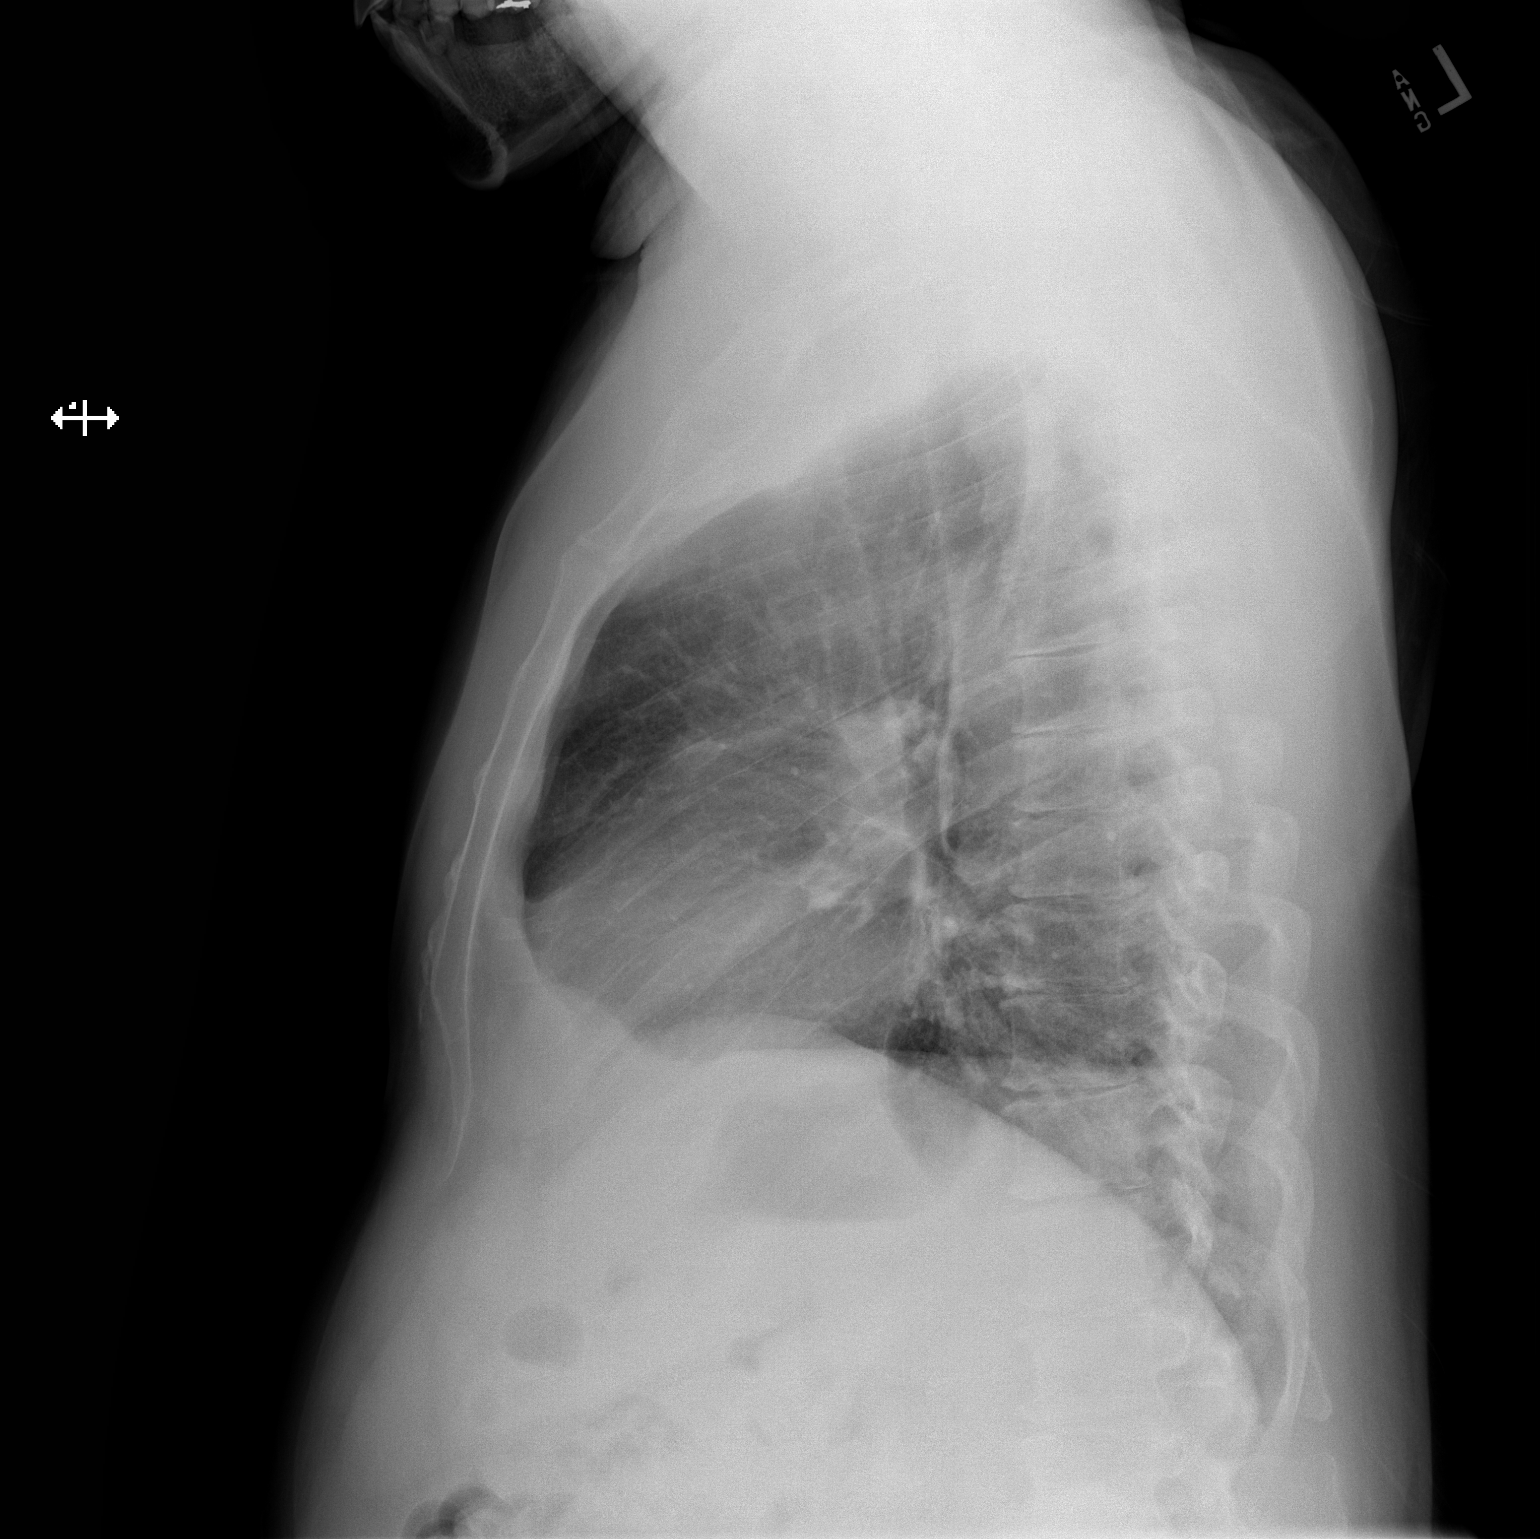

[2 of 2 positions shown; findings below may reference images not displayed]

FINDINGS: The heart size and mediastinal contours are within normal limits.
Both lungs are clear. No pleural effusion or pneumothorax. The
visualized skeletal structures are unremarkable.
IMPRESSION: No active cardiopulmonary disease.

## 2021-09-26 ENCOUNTER — Other Ambulatory Visit: Payer: Self-pay | Admitting: Pharmacist

## 2021-09-26 NOTE — Chronic Care Management (AMB) (Signed)
Patient seen by Camila Li Day, PharmD Candidate on 09/26/21 while they were picking up prescriptions at [place] at [facility].   Patient has an automated home blood pressure machine, but they are unsure if it is working.   Medication review was performed. They are not taking medications as prescribed, he has not had medication in a week. They have had a hard time getting refills from [facility], but are going to be able to pick them up today from [facility].   The following barriers to adherence were noted: - Affordability and - Needing Refills  The following interventions were completed:  - Medications were reviewed - Patient was educated on medications, including indication and administration  The patient has follow up scheduled:  PCP: per patient, has follow up 6/26 at PCP   Catie TJodi Mourning, PharmD, [place] [phone]

## 2021-12-11 ENCOUNTER — Encounter (HOSPITAL_COMMUNITY): Payer: Self-pay

## 2021-12-11 ENCOUNTER — Emergency Department (HOSPITAL_COMMUNITY)
Admission: EM | Admit: 2021-12-11 | Discharge: 2021-12-11 | Disposition: A | Discharge status: Home / Self Care | Payer: Self-pay | Attending: Emergency Medicine | Admitting: Emergency Medicine

## 2021-12-11 DIAGNOSIS — L03012 Cellulitis of left finger: Secondary | ICD-10-CM | POA: Insufficient documentation

## 2021-12-11 DIAGNOSIS — S60943A Unspecified superficial injury of left middle finger, initial encounter: Secondary | ICD-10-CM | POA: Insufficient documentation

## 2021-12-11 DIAGNOSIS — Y990 Civilian activity done for income or pay: Secondary | ICD-10-CM | POA: Insufficient documentation

## 2021-12-11 DIAGNOSIS — X58XXXA Exposure to other specified factors, initial encounter: Secondary | ICD-10-CM | POA: Insufficient documentation

## 2021-12-11 MED ORDER — LIDOCAINE HCL (PF) 1 % IJ SOLN
10.0000 mL | Freq: Once | INTRAMUSCULAR | Status: AC
  Administered 2021-12-11: 10 mL
  Filled 2021-12-11: qty 30

## 2021-12-11 MED ORDER — DOXYCYCLINE HYCLATE 100 MG PO CAPS: 100.0000 mg | ORAL_CAPSULE | Freq: Two times a day (BID) | ORAL | 0 refills | Status: AC

## 2021-12-11 MED ORDER — ACETAMINOPHEN 500 MG PO TABS: 1000.0000 mg | ORAL_TABLET | Freq: Four times a day (QID) | ORAL | 0 refills | Status: AC | PRN

## 2021-12-11 NOTE — ED Triage Notes (Signed)
Pt states that at work Monday or Tuesday he jammed his L middle finger. He noticed the pain increasing last night. Pt able to move all fingers freely without issue.

## 2021-12-11 NOTE — ED Provider Notes (Signed)
[name]-EMERGENCY DEPT Provider Note   CSN: [id number] Arrival date & time: 12/11/21  1222     History  Chief Complaint  Patient presents with   Finger Injury    Nicholas Moss is a 51 y.o. male.  HPI  Patient is a 51 year old male presented emergency room today with left middle finger pain he states that he "jammed his finger" while at work and 2 days later developed some pain in the side of his nail/fingertip.  He states he has good sensation here is able to move his finger without difficulty.  He states the pain radiates back towards his hand but he has now pain in the palm of his hand.  He states that he is not had any difficulty with movement or dexterity.       Home Medications Prior to Admission medications   Medication Sig Start Date End Date Taking? Authorizing Provider  acetaminophen (TYLENOL) 500 MG tablet Take 2 tablets (1,000 mg total) by mouth every 6 (six) hours as needed. 12/11/21  Yes Indira Sorenson, Stevphen Meuse S, PA  doxycycline (VIBRAMYCIN) 100 MG capsule Take 1 capsule (100 mg total) by mouth 2 (two) times daily for 7 days. 12/11/21 12/18/21 Yes Aubreigh Fuerte S, PA  budesonide-formoterol (SYMBICORT) 160-4.5 MCG/ACT inhaler Inhale 2 puffs into the lungs 2 (two) times daily.    [provider]  cyclobenzaprine (FLEXERIL) 10 MG tablet Take 1 tablet (10 mg total) by mouth 2 (two) times daily as needed for muscle spasms. 06/16/21   Janell Quiet, PA-C  Diclofenac Sodium (PENNSAID) 2 % SOLN Place 2 Bottles onto the skin 2 (two) times daily as needed.    [provider]  ibuprofen (ADVIL,MOTRIN) 800 MG tablet Take 1 tablet (800 mg total) by mouth 3 (three) times daily. 06/12/18   Tilden Fossa, MD  methocarbamol (ROBAXIN) 500 MG tablet Take 2 tablets (1,000 mg total) by mouth every 6 (six) hours as needed for muscle spasms. 06/12/18   Tilden Fossa, MD  naproxen (NAPROSYN) 500 MG tablet Take 1 tablet (500 mg total) by mouth 2 (two) times  daily. 02/16/19   Renne Crigler, PA-C  naproxen (NAPROSYN) 500 MG tablet Take 1 tablet (500 mg total) by mouth 2 (two) times daily. 06/16/21   Janell Quiet, PA-C  predniSONE (DELTASONE) 10 MG tablet Take 2 tablets (20 mg total) by mouth daily. 08/17/20   Margarita Grizzle, MD  triamterene-hydrochlorothiazide (MAXZIDE-25) 37.5-25 MG tablet Take 1 tablet by mouth daily.    [provider]      Allergies    Patient has no known allergies.    Review of Systems   Review of Systems  Physical Exam Updated Vital Signs BP 131/89 (BP Location: Right Arm)   Pulse 72   Temp 98 F (36.7 C) (Oral)   Resp 18   Ht 5\' 5"  (1.651 m)   Wt 77.6 kg   SpO2 95%   BMI 28.46 kg/m  Physical Exam Vitals and nursing note reviewed.  Constitutional:      General: He is not in acute distress.    Appearance: Normal appearance. He is not ill-appearing.  HENT:     Head: Normocephalic and atraumatic.  Eyes:     General: No scleral icterus.       Right eye: No discharge.        Left eye: No discharge.     Conjunctiva/sclera: Conjunctivae normal.  Pulmonary:     Effort: Pulmonary effort is normal.  Breath sounds: No stridor.  Skin:    Comments: Paronychia of left middle finger  Neurological:     Mental Status: He is alert and oriented to person, place, and time. Mental status is at baseline.     ED Results / Procedures / Treatments   Labs (all labs ordered are listed, but only abnormal results are displayed) Labs Reviewed - No data to display  EKG None  Radiology No results found.  Procedures Drain paronychia  Date/Time: 12/11/2021 3:38 PM  Performed by: Gailen Shelter, PA Authorized by: Gailen Shelter, PA  Consent: Verbal consent obtained. Risks and benefits: risks, benefits and alternatives were discussed Consent given by: patient Patient understanding: patient states understanding of the procedure being performed Patient consent: the patient's understanding of the procedure  matches consent given Relevant documents: relevant documents present and verified Test results: test results available and properly labeled Imaging studies: imaging studies available Patient identity confirmed: verbally with patient and arm band Local anesthesia used: no  Anesthesia: Local anesthesia used: no  Sedation: Patient sedated: no  Patient tolerance: patient tolerated the procedure well with no immediate complications   .Nerve Block  Date/Time: 12/11/2021 3:38 PM  Performed by: Gailen Shelter, PA Authorized by: Gailen Shelter, PA   Consent:    Consent obtained:  Verbal   Consent given by:  Patient   Risks discussed:  Unsuccessful block, nerve damage, infection, intravenous injection and pain   Alternatives discussed:  No treatment, delayed treatment and referral Indications:    Indications:  Pain relief and procedural anesthesia Location:    Body area:  Upper extremity Pre-procedure details:    Skin preparation:  Alcohol Procedure details:    Block needle gauge:  25 G   Anesthetic injected:  Lidocaine 1% w/o epi   Steroid injected:  None   Additive injected:  None   Injection procedure:  Anatomic landmarks identified, incremental injection, negative aspiration for blood and anatomic landmarks palpated Post-procedure details:    Dressing:  Sterile dressing   Outcome:  Anesthesia achieved   Procedure completion:  Tolerated well, no immediate complications Comments:     Successful digital block     Medications Ordered in ED Medications  lidocaine (PF) (XYLOCAINE) 1 % injection 10 mL (10 mLs Infiltration Given by Other 12/11/21 1444)    ED Course/ Medical Decision Making/ A&P                           Medical Decision Making Risk OTC drugs. Prescription drug management.   Patient is a 51 year old male presented emergency room today with left middle finger pain he states that he "jammed his finger" while at work and 2 days later developed some pain  in the side of his nail/fingertip.  He states he has good sensation here is able to move his finger without difficulty.  He states the pain radiates back towards his hand but he has now pain in the palm of his hand.  He states that he is not had any difficulty with movement or dexterity.   Patient has no bony tenderness.  We discussed the pros and cons of obtaining an x-ray.  We will hold off on this.  Patient has a clear paronychia.  Will drain.  We discussed risks and benefits of digital block and incision and drainage of paronychia.  He agrees to go forward with this.   Final Clinical Impression(s) / ED Diagnoses Final diagnoses:  Paronychia, finger, left    Rx / DC Orders ED Discharge Orders          Ordered    doxycycline (VIBRAMYCIN) 100 MG capsule  2 times daily        12/11/21 1432    acetaminophen (TYLENOL) 500 MG tablet  Every 6 hours PRN        12/11/21 1432              Gailen Shelter, [place] 12/11/21 1539    Benjiman Core, MD 12/13/21 1456

## 2021-12-11 NOTE — Discharge Instructions (Signed)
Take antibiotics as prescribed.  Follow directions on handout  Please use Tylenol or ibuprofen for pain.  You may use 600 mg ibuprofen every 6 hours or 1000 mg of Tylenol every 6 hours.  You may choose to alternate between the 2.  This would be most effective.  Not to exceed 4 g of Tylenol within 24 hours.  Not to exceed 3200 mg ibuprofen 24 hours.

## 2021-12-19 ENCOUNTER — Emergency Department (HOSPITAL_COMMUNITY)
Admission: EM | Admit: 2021-12-19 | Discharge: 2021-12-19 | Disposition: A | Discharge status: Home / Self Care | Payer: Self-pay | Attending: Emergency Medicine | Admitting: Emergency Medicine

## 2021-12-19 ENCOUNTER — Other Ambulatory Visit: Payer: Self-pay

## 2021-12-19 ENCOUNTER — Encounter (HOSPITAL_COMMUNITY): Payer: Self-pay

## 2021-12-19 DIAGNOSIS — Z79899 Other long term (current) drug therapy: Secondary | ICD-10-CM | POA: Insufficient documentation

## 2021-12-19 DIAGNOSIS — Z7951 Long term (current) use of inhaled steroids: Secondary | ICD-10-CM | POA: Insufficient documentation

## 2021-12-19 DIAGNOSIS — M545 Low back pain, unspecified: Secondary | ICD-10-CM | POA: Insufficient documentation

## 2021-12-19 DIAGNOSIS — I10 Essential (primary) hypertension: Secondary | ICD-10-CM | POA: Insufficient documentation

## 2021-12-19 DIAGNOSIS — J45909 Unspecified asthma, uncomplicated: Secondary | ICD-10-CM | POA: Insufficient documentation

## 2021-12-19 MED ORDER — CYCLOBENZAPRINE HCL 10 MG PO TABS: 10.0000 mg | ORAL_TABLET | Freq: Two times a day (BID) | ORAL | 0 refills | Status: AC | PRN

## 2021-12-19 MED ORDER — NAPROXEN 500 MG PO TABS: 500.0000 mg | ORAL_TABLET | Freq: Two times a day (BID) | ORAL | 0 refills | Status: AC

## 2021-12-19 MED ORDER — METHOCARBAMOL 500 MG PO TABS
750.0000 mg | ORAL_TABLET | Freq: Once | ORAL | Status: AC
  Administered 2021-12-19: 750 mg via ORAL
  Filled 2021-12-19: qty 2

## 2021-12-19 MED ORDER — KETOROLAC TROMETHAMINE 30 MG/ML IJ SOLN
30.0000 mg | Freq: Once | INTRAMUSCULAR | Status: AC
  Administered 2021-12-19: 30 mg via INTRAMUSCULAR
  Filled 2021-12-19: qty 1

## 2021-12-19 NOTE — ED Triage Notes (Signed)
Patient said his lower back has been hurting for 2 days. He said when he sneezes or coughs his back hurts.

## 2021-12-19 NOTE — ED Provider Notes (Signed)
[facility]-EMERGENCY DEPT Provider Note   CSN: [id number] Arrival date & time: 12/19/21  3151     History PMH: HTN, Asthma Chief Complaint  Patient presents with   Back Pain    Nicholas Moss is a 51 y.o. male. Patient presents to the ED with a chief complaint of left lower back pain that started yesterday morning when he awoke.  Says this pain has been constant.  He has tried taking a low-dose of Flexeril as well as doing a lidocaine patch without much relief.  He says that he used to be on Flexeril and naproxen, but he has been out of his prescriptions for this.  He states that he has a history of lower back spasms and this feels identical to that.  He denies any abdominal pain, flank pain, dysuria, hematuria, bowel or bladder dysfunction, weakness or numbness of lower extremities, gait abnormalities, fever, chills.  He says that he is established with a PCP, however he has not been to get back in because of some bills that he owes.   Back Pain      Home Medications Prior to Admission medications   Medication Sig Start Date End Date Taking? Authorizing Provider  acetaminophen (TYLENOL) 500 MG tablet Take 2 tablets (1,000 mg total) by mouth every 6 (six) hours as needed. 12/11/21   Gailen Shelter, PA  budesonide-formoterol (SYMBICORT) 160-4.5 MCG/ACT inhaler Inhale 2 puffs into the lungs 2 (two) times daily.    [provider]  cyclobenzaprine (FLEXERIL) 10 MG tablet Take 1 tablet (10 mg total) by mouth 2 (two) times daily as needed for up to 14 days for muscle spasms. 12/19/21 01/02/22  Shell Yandow, Finis Bud, PA-C  Diclofenac Sodium (PENNSAID) 2 % SOLN Place 2 Bottles onto the skin 2 (two) times daily as needed.    [provider]  ibuprofen (ADVIL,MOTRIN) 800 MG tablet Take 1 tablet (800 mg total) by mouth 3 (three) times daily. 06/12/18   Tilden Fossa, MD  methocarbamol (ROBAXIN) 500 MG tablet Take 2 tablets (1,000 mg total) by mouth every 6  (six) hours as needed for muscle spasms. 06/12/18   Tilden Fossa, MD  naproxen (NAPROSYN) 500 MG tablet Take 1 tablet (500 mg total) by mouth 2 (two) times daily for 7 days. 12/19/21 12/26/21  Bedelia Pong, Finis Bud, PA-C  predniSONE (DELTASONE) 10 MG tablet Take 2 tablets (20 mg total) by mouth daily. 08/17/20   Margarita Grizzle, MD  triamterene-hydrochlorothiazide (MAXZIDE-25) 37.5-25 MG tablet Take 1 tablet by mouth daily.    [provider]      Allergies    Patient has no known allergies.    Review of Systems   Review of Systems  Musculoskeletal:  Positive for back pain.  All other systems reviewed and are negative.   Physical Exam Updated Vital Signs BP 117/76 (BP Location: Left Arm)   Pulse 84   Temp 98.8 F (37.1 C) (Oral)   Resp 18   Ht 5\' 5"  (1.651 m)   Wt 77.6 kg   SpO2 99%   BMI 28.46 kg/m  Physical Exam Vitals and nursing note reviewed.  Constitutional:      General: He is not in acute distress.    Appearance: Normal appearance. He is well-developed. He is not ill-appearing, toxic-appearing or diaphoretic.  HENT:     Head: Normocephalic and atraumatic.     Nose: No nasal deformity.     Mouth/Throat:     Lips: Pink. No lesions.  Eyes:  General: Gaze aligned appropriately. No scleral icterus.       Right eye: No discharge.        Left eye: No discharge.     Conjunctiva/sclera: Conjunctivae normal.     Right eye: Right conjunctiva is not injected. No exudate or hemorrhage.    Left eye: Left conjunctiva is not injected. No exudate or hemorrhage. Pulmonary:     Effort: Pulmonary effort is normal. No respiratory distress.  Musculoskeletal:     Comments: No midline tenderness of spine, no stepoff or deformity; reproducible muscular tenderness in paraspinal muscles on the left DP/PT pulses 2+ and equal bilaterally No leg edema Sensation grossly intact on anterior thighs, dorsum of foot and lateral foot Strength of knee flexion and extension is 5/5 Plantar and  dorsiflexion of ankle 5/5 Patellar reflexes present and equal   Skin:    General: Skin is warm and dry.  Neurological:     Mental Status: He is alert and oriented to person, place, and time.  Psychiatric:        Mood and Affect: Mood normal.        Speech: Speech normal.        Behavior: Behavior normal. Behavior is cooperative.     ED Results / Procedures / Treatments   Labs (all labs ordered are listed, but only abnormal results are displayed) Labs Reviewed - No data to display  EKG None  Radiology No results found.  Procedures Procedures   Medications Ordered in ED Medications  ketorolac (TORADOL) 30 MG/ML injection 30 mg (30 mg Intramuscular Given 12/19/21 0714)  methocarbamol (ROBAXIN) tablet 750 mg (750 mg Oral Given 12/19/21 8144)    ED Course/ Medical Decision Making/ A&P                           Medical Decision Making Risk Prescription drug management.   Patient is presenting with 1 day of left lower back pain.  He is stable vitals and appears well.  He has a history of identical pain to this. No red flag back pain symptoms on history or exam. Exam with no neurological dysfunction noted. I suspect musculoskeletal cause to symptoms. I have given him a dose of Robaxin and a Toradol injection and plan to reassess. I do not feel that imaging is indicated at this time.   Reassessment reveals improved symptoms. I recommend PCP follow up. Return precautions provided.    Final Clinical Impression(s) / ED Diagnoses Final diagnoses:  Acute left-sided low back pain without sciatica    Rx / DC Orders ED Discharge Orders          Ordered    cyclobenzaprine (FLEXERIL) 10 MG tablet  2 times daily PRN        12/19/21 0759    naproxen (NAPROSYN) 500 MG tablet  2 times daily        12/19/21 0759              Steffany Schoenfelder, Finis Bud, PA-C 12/19/21 8185    Lorre Nick, MD 12/22/21 1016

## 2021-12-19 NOTE — Discharge Instructions (Addendum)
I have refilled your Naproxen and Flexeril.  Please schedule a follow up visit with your PCP or I have provided you with an Orthopedic follow up if you are unable to get in with your PCP.

## 2022-04-01 ENCOUNTER — Telehealth: Payer: Self-pay | Admitting: Nurse Practitioner

## 2022-04-01 ENCOUNTER — Other Ambulatory Visit: Payer: Self-pay

## 2022-04-01 MED ORDER — TRIAMTERENE-HCTZ 37.5-25 MG PO TABS: 1.0000 | ORAL_TABLET | Freq: Every day | ORAL | 0 refills | Status: DC

## 2022-04-01 NOTE — Telephone Encounter (Signed)
Pt has a new pt appt on 1/18 but only has 4 triamterene-hydrochlorothiazide and fluticasone propionte/ salmeterol discus inhaltion powder    Send to [facility] on [address] please   Call back at [phone]

## 2022-04-02 ENCOUNTER — Other Ambulatory Visit: Payer: Self-pay | Admitting: Nurse Practitioner

## 2022-04-02 MED ORDER — TRIAMTERENE-HCTZ 37.5-25 MG PO TABS: 1.0000 | ORAL_TABLET | Freq: Every day | ORAL | 0 refills | Status: DC

## 2022-04-02 MED ORDER — BUDESONIDE-FORMOTEROL FUMARATE 160-4.5 MCG/ACT IN AERO: 2.0000 | INHALATION_SPRAY | Freq: Two times a day (BID) | RESPIRATORY_TRACT | 2 refills | Status: AC

## 2022-04-30 ENCOUNTER — Ambulatory Visit (INDEPENDENT_AMBULATORY_CARE_PROVIDER_SITE_OTHER): Payer: Self-pay | Admitting: Nurse Practitioner

## 2022-04-30 ENCOUNTER — Encounter: Payer: Self-pay | Admitting: Nurse Practitioner

## 2022-04-30 VITALS — BP 112/81 | HR 83 | Temp 97.7°F | Ht 65.0 in | Wt 172.0 lb

## 2022-04-30 DIAGNOSIS — J4521 Mild intermittent asthma with (acute) exacerbation: Secondary | ICD-10-CM | POA: Insufficient documentation

## 2022-04-30 DIAGNOSIS — Z0000 Encounter for general adult medical examination without abnormal findings: Secondary | ICD-10-CM

## 2022-04-30 DIAGNOSIS — G8929 Other chronic pain: Secondary | ICD-10-CM

## 2022-04-30 DIAGNOSIS — M545 Low back pain, unspecified: Secondary | ICD-10-CM

## 2022-04-30 DIAGNOSIS — I10 Essential (primary) hypertension: Secondary | ICD-10-CM | POA: Insufficient documentation

## 2022-04-30 DIAGNOSIS — Z13220 Encounter for screening for lipoid disorders: Secondary | ICD-10-CM

## 2022-04-30 MED ORDER — TRIAMTERENE-HCTZ 37.5-25 MG PO TABS: 1.0000 | ORAL_TABLET | Freq: Every day | ORAL | 0 refills | Status: DC

## 2022-04-30 MED ORDER — PREDNISONE 20 MG PO TABS: 20.0000 mg | ORAL_TABLET | Freq: Every day | ORAL | 0 refills | Status: AC

## 2022-04-30 MED ORDER — NAPROXEN 500 MG PO TABS: 500.0000 mg | ORAL_TABLET | Freq: Two times a day (BID) | ORAL | 0 refills | Status: AC

## 2022-04-30 MED ORDER — ALBUTEROL SULFATE HFA 108 (90 BASE) MCG/ACT IN AERS: 2.0000 | INHALATION_SPRAY | Freq: Four times a day (QID) | RESPIRATORY_TRACT | 5 refills | Status: AC | PRN

## 2022-04-30 MED ORDER — ALBUTEROL SULFATE (2.5 MG/3ML) 0.083% IN NEBU: 2.5000 mg | INHALATION_SOLUTION | Freq: Four times a day (QID) | RESPIRATORY_TRACT | 3 refills | Status: AC | PRN

## 2022-04-30 MED ORDER — FLUTICASONE-SALMETEROL 250-50 MCG/ACT IN AEPB: 1.0000 | INHALATION_SPRAY | Freq: Two times a day (BID) | RESPIRATORY_TRACT | 5 refills | Status: DC

## 2022-04-30 MED ORDER — CYCLOBENZAPRINE HCL 10 MG PO TABS: 10.0000 mg | ORAL_TABLET | Freq: Three times a day (TID) | ORAL | 3 refills | Status: DC | PRN

## 2022-04-30 NOTE — Patient Instructions (Addendum)
1. Mild intermittent asthma with exacerbation  - fluticasone-salmeterol (ADVAIR) 250-50 MCG/ACT AEPB; Inhale 1 puff into the lungs 2 (two) times daily.  Dispense: 14 each; Refill: 5 - albuterol (VENTOLIN HFA) 108 (90 Base) MCG/ACT inhaler; Inhale 2 puffs into the lungs every 6 (six) hours as needed for wheezing or shortness of breath.  Dispense: 18 g; Refill: 5 - predniSONE (DELTASONE) 20 MG tablet; Take 1 tablet (20 mg total) by mouth daily with breakfast for 5 days.  Dispense: 5 tablet; Refill: 0  2. Primary hypertension  - triamterene-hydrochlorothiazide (MAXZIDE-25) 37.5-25 MG tablet; Take 1 tablet by mouth daily.  Dispense: 90 tablet; Refill: 0 - CBC - Comprehensive metabolic panel - Lipid Panel  3. Chronic bilateral low back pain without sciatica   4. Lipid screening  - Lipid Panel  Follow up:  Follow up in 3 months

## 2022-04-30 NOTE — Progress Notes (Signed)
Complete physical exam  Patient: Nicholas Moss   DOB: 09/11/1970   52 y.o. Male  MRN: [id number]  Subjective:    Chief Complaint  Patient presents with   Establish Care    Not fasting     Nicholas Moss is a 52 y.o. male who presents today for a complete physical exam. He reports consuming a general diet. The patient has a physically strenuous job, but has no regular exercise apart from work.  He generally feels fairly well. He reports sleeping fairly well. He does have additional problems to discuss today.    Most recent fall risk assessment:     No data to display           Most recent depression screenings:    04/30/2022    9:32 AM  PHQ 2/9 Scores  PHQ - 2 Score 0      Patient Active Problem List   Diagnosis Date Noted   Mild intermittent asthma with exacerbation 04/30/2022   Primary hypertension 04/30/2022   Chronic bilateral low back pain without sciatica 04/30/2022   Past Medical History:  Diagnosis Date   Asthma    Eczema    Hypertension    Past Surgical History:  Procedure Laterality Date   VASECTOMY     Social History   Tobacco Use   Smoking status: Never   Smokeless tobacco: Never  Vaping Use   Vaping Use: Never used  Substance Use Topics   Alcohol use: No   Drug use: No   No Known Allergies    Patient Care Team: [facility] as PCP - General   Outpatient Medications Prior to Visit  Medication Sig   acetaminophen (TYLENOL) 500 MG tablet Take 2 tablets (1,000 mg total) by mouth every 6 (six) hours as needed.   montelukast (SINGULAIR) 10 MG tablet Take 10 mg by mouth at bedtime.   [DISCONTINUED] albuterol (PROVENTIL) (2.5 MG/3ML) 0.083% nebulizer solution Take 2.5 mg by nebulization every 6 (six) hours as needed for wheezing or shortness of breath.   [DISCONTINUED] albuterol (VENTOLIN HFA) 108 (90 Base) MCG/ACT inhaler Inhale into the lungs every 6 (six) hours as needed for wheezing or shortness of breath.   [DISCONTINUED]  fluticasone-salmeterol (ADVAIR) 250-50 MCG/ACT AEPB 2 (two) times daily.   [DISCONTINUED] naproxen (NAPROSYN) 500 MG tablet Take 500 mg by mouth 2 (two) times daily with a meal.   [DISCONTINUED] triamterene-hydrochlorothiazide (MAXZIDE-25) 37.5-25 MG tablet Take 1 tablet by mouth daily.   budesonide-formoterol (SYMBICORT) 160-4.5 MCG/ACT inhaler Inhale 2 puffs into the lungs 2 (two) times daily. (Patient not taking: Reported on 04/30/2022)   Diclofenac Sodium (PENNSAID) 2 % SOLN Place 2 Bottles onto the skin 2 (two) times daily as needed. (Patient not taking: Reported on 04/30/2022)   ergocalciferol (VITAMIN D2) 1.25 MG (50000 UT) capsule Take 50,000 Units by mouth once a week.   hydrOXYzine (VISTARIL) 25 MG capsule Take 25 mg by mouth 3 (three) times daily as needed.   ibuprofen (ADVIL,MOTRIN) 800 MG tablet Take 1 tablet (800 mg total) by mouth 3 (three) times daily. (Patient not taking: Reported on 04/30/2022)   methocarbamol (ROBAXIN) 500 MG tablet Take 2 tablets (1,000 mg total) by mouth every 6 (six) hours as needed for muscle spasms. (Patient not taking: Reported on 04/30/2022)   predniSONE (DELTASONE) 10 MG tablet Take 2 tablets (20 mg total) by mouth daily. (Patient not taking: Reported on 04/30/2022)   [DISCONTINUED] cyclobenzaprine (FLEXERIL) 10 MG tablet Take 10 mg by mouth 3 (  three) times daily as needed for muscle spasms.   No facility-administered medications prior to visit.    Review of Systems  Constitutional: Negative.   HENT: Negative.    Eyes: Negative.   Respiratory: Negative.    Cardiovascular: Negative.   Gastrointestinal: Negative.   Genitourinary: Negative.   Musculoskeletal: Negative.   Skin: Negative.   Neurological: Negative.   Endo/Heme/Allergies: Negative.   Psychiatric/Behavioral: Negative.            Objective:     BP 112/81   Pulse 83   Temp 97.7 F (36.5 C)   Ht 5\' 5"  (1.651 m)   Wt 172 lb (78 kg)   SpO2 99%   BMI 28.62 kg/m  BP Readings from  Last 3 Encounters:  04/30/22 112/81  12/19/21 117/76  12/11/21 131/89   Wt Readings from Last 3 Encounters:  04/30/22 172 lb (78 kg)  12/19/21 171 lb (77.6 kg)  12/11/21 171 lb (77.6 kg)      Physical Exam Vitals and nursing note reviewed.  Constitutional:      General: He is not in acute distress.    Appearance: He is well-developed.  Cardiovascular:     Rate and Rhythm: Normal rate and regular rhythm.  Pulmonary:     Effort: Pulmonary effort is normal.     Breath sounds: Normal breath sounds.  Skin:    General: Skin is warm and dry.  Neurological:     Mental Status: He is alert and oriented to person, place, and time.      No results found for any visits on 04/30/22. Last CBC Lab Results  Component Value Date   WBC 4.5 08/17/2020   HGB 16.6 08/17/2020   HCT 50.0 08/17/2020   MCV 90.6 08/17/2020   MCH 30.1 08/17/2020   RDW 12.0 08/17/2020   PLT 222 10/07/9483   Last metabolic panel Lab Results  Component Value Date   GLUCOSE 90 08/17/2020   NA 135 08/17/2020   K 3.6 08/17/2020   CL 101 08/17/2020   CO2 25 08/17/2020   BUN 14 08/17/2020   CREATININE 1.19 08/17/2020   GFRNONAA >60 08/17/2020   CALCIUM 9.1 08/17/2020   PROT 7.9 08/17/2020   ALBUMIN 3.9 08/17/2020   BILITOT 0.9 08/17/2020   ALKPHOS 70 08/17/2020   AST 40 08/17/2020   ALT 45 (H) 08/17/2020   ANIONGAP 9 08/17/2020   Last lipids No results found for: "CHOL", "HDL", "LDLCALC", "LDLDIRECT", "TRIG", "CHOLHDL" Last hemoglobin A1c No results found for: "HGBA1C" Last thyroid functions No results found for: "TSH", "T3TOTAL", "T4TOTAL", "THYROIDAB" Last vitamin D No results found for: "25OHVITD2", "25OHVITD3", "VD25OH" Last vitamin B12 and Folate No results found for: "VITAMINB12", "FOLATE"      Assessment & Plan:    Routine Health Maintenance and Physical Exam   There is no immunization history on file for this patient.  Health Maintenance  Topic Date Due   COVID-19 Vaccine (1)  Never done   Hepatitis C Screening  Never done   DTaP/Tdap/Td (1 - Tdap) Never done   Fecal DNA (Cologuard)  Never done   Zoster Vaccines- Shingrix (1 of 2) Never done   INFLUENZA VACCINE  07/12/2022 (Originally 11/11/2021)   HIV Screening  05/01/2023 (Originally 05/17/1986)   HPV VACCINES  Aged Out    Discussed health benefits of physical activity, and encouraged him to engage in regular exercise appropriate for his age and condition.  Problem List Items Addressed This Visit  Cardiovascular and Mediastinum   Primary hypertension   Relevant Medications   triamterene-hydrochlorothiazide (MAXZIDE-25) 37.5-25 MG tablet   Other Relevant Orders   CBC   Comprehensive metabolic panel   Lipid Panel     Respiratory   Mild intermittent asthma with exacerbation   Relevant Medications   montelukast (SINGULAIR) 10 MG tablet   fluticasone-salmeterol (ADVAIR) 250-50 MCG/ACT AEPB   albuterol (VENTOLIN HFA) 108 (90 Base) MCG/ACT inhaler   albuterol (PROVENTIL) (2.5 MG/3ML) 0.083% nebulizer solution   predniSONE (DELTASONE) 20 MG tablet     Other   Chronic bilateral low back pain without sciatica   Relevant Medications   naproxen (NAPROSYN) 500 MG tablet   cyclobenzaprine (FLEXERIL) 10 MG tablet   predniSONE (DELTASONE) 20 MG tablet   Other Visit Diagnoses     Routine adult health maintenance    -  Primary   Lipid screening       Relevant Orders   Lipid Panel      Return in about 6 months (around 10/29/2022), or if symptoms worsen or fail to improve.     1. Mild intermittent asthma with exacerbation  - fluticasone-salmeterol (ADVAIR) 250-50 MCG/ACT AEPB; Inhale 1 puff into the lungs 2 (two) times daily.  Dispense: 14 each; Refill: 5 - albuterol (VENTOLIN HFA) 108 (90 Base) MCG/ACT inhaler; Inhale 2 puffs into the lungs every 6 (six) hours as needed for wheezing or shortness of breath.  Dispense: 18 g; Refill: 5 - predniSONE (DELTASONE) 20 MG tablet; Take 1 tablet (20 mg total)  by mouth daily with breakfast for 5 days.  Dispense: 5 tablet; Refill: 0  2. Primary hypertension  - triamterene-hydrochlorothiazide (MAXZIDE-25) 37.5-25 MG tablet; Take 1 tablet by mouth daily.  Dispense: 90 tablet; Refill: 0 - CBC - Comprehensive metabolic panel - Lipid Panel  3. Chronic bilateral low back pain without sciatica   4. Lipid screening  - Lipid Panel  Follow up:  Follow up in 3 months   Fenton Foy, NP 04/30/22

## 2022-05-01 LAB — CBC
Hematocrit: 51.8 % — ABNORMAL HIGH (ref 37.5–51.0)
Hemoglobin: 16.9 g/dL (ref 13.0–17.7)
MCH: 30.1 pg (ref 26.6–33.0)
MCHC: 32.6 g/dL (ref 31.5–35.7)
MCV: 92 fL (ref 79–97)
Platelets: 302 10*3/uL (ref 150–450)
RBC: 5.62 x10E6/uL (ref 4.14–5.80)
RDW: 12 % (ref 11.6–15.4)
WBC: 5.6 10*3/uL (ref 3.4–10.8)

## 2022-05-01 LAB — LIPID PANEL
Chol/HDL Ratio: 4.2 ratio (ref 0.0–5.0)
Cholesterol, Total: 216 mg/dL — ABNORMAL HIGH (ref 100–199)
HDL: 52 mg/dL (ref >39)
LDL Chol Calc (NIH): 142 mg/dL — ABNORMAL HIGH (ref 0–99)
Triglycerides: 121 mg/dL (ref 0–149)
VLDL Cholesterol Cal: 22 mg/dL (ref 5–40)

## 2022-05-01 LAB — COMPREHENSIVE METABOLIC PANEL
ALT: 37 IU/L (ref 0–44)
AST: 29 IU/L (ref 0–40)
Albumin/Globulin Ratio: 1.5 (ref 1.2–2.2)
Albumin: 4.7 g/dL (ref 4.1–5.1)
Alkaline Phosphatase: 98 IU/L (ref 44–121)
BUN/Creatinine Ratio: 7 — ABNORMAL LOW (ref 9–20)
BUN: 8 mg/dL (ref 6–24)
Bilirubin Total: 0.4 mg/dL (ref 0.0–1.2)
CO2: 22 mmol/L (ref 20–29)
Calcium: 10.1 mg/dL (ref 8.7–10.2)
Chloride: 99 mmol/L (ref 96–106)
Creatinine, Ser: 1.09 mg/dL (ref 0.76–1.27)
Globulin, Total: 3.1 g/dL (ref 1.5–4.5)
Glucose: 86 mg/dL (ref 70–99)
Potassium: 3.9 mmol/L (ref 3.5–5.2)
Sodium: 140 mmol/L (ref 134–144)
Total Protein: 7.8 g/dL (ref 6.0–8.5)
eGFR: 83 mL/min/{1.73_m2} (ref >59)

## 2022-07-26 ENCOUNTER — Other Ambulatory Visit: Payer: Self-pay | Admitting: Nurse Practitioner

## 2022-07-26 DIAGNOSIS — I10 Essential (primary) hypertension: Secondary | ICD-10-CM

## 2022-10-19 ENCOUNTER — Other Ambulatory Visit: Payer: Self-pay | Admitting: Nurse Practitioner

## 2022-10-19 DIAGNOSIS — I10 Essential (primary) hypertension: Secondary | ICD-10-CM

## 2022-10-23 ENCOUNTER — Ambulatory Visit: Payer: BC Managed Care – PPO | Admitting: Podiatry

## 2022-10-30 ENCOUNTER — Ambulatory Visit (INDEPENDENT_AMBULATORY_CARE_PROVIDER_SITE_OTHER): Payer: BC Managed Care – PPO | Admitting: Nurse Practitioner

## 2022-10-30 ENCOUNTER — Encounter: Payer: Self-pay | Admitting: Nurse Practitioner

## 2022-10-30 VITALS — BP 115/84 | HR 71 | Temp 97.4°F | Wt 168.4 lb

## 2022-10-30 DIAGNOSIS — L309 Dermatitis, unspecified: Secondary | ICD-10-CM

## 2022-10-30 DIAGNOSIS — I10 Essential (primary) hypertension: Secondary | ICD-10-CM

## 2022-10-30 MED ORDER — MONTELUKAST SODIUM 10 MG PO TABS: 10.0000 mg | ORAL_TABLET | Freq: Every day | ORAL | 0 refills | Status: AC

## 2022-10-30 MED ORDER — CYCLOBENZAPRINE HCL 10 MG PO TABS: 10.0000 mg | ORAL_TABLET | Freq: Three times a day (TID) | ORAL | 3 refills | Status: AC | PRN

## 2022-10-30 MED ORDER — TRIAMCINOLONE ACETONIDE 0.1 % EX CREA: 1.0000 | TOPICAL_CREAM | Freq: Two times a day (BID) | CUTANEOUS | 0 refills | Status: DC

## 2022-10-30 NOTE — Patient Instructions (Addendum)
1. Primary hypertension  -continue current medications  -may start OTC flaxseed oil daily  2. Eczema, unspecified type  - triamcinolone cream (KENALOG) 0.1 %; Apply 1 Application topically 2 (two) times daily.  Dispense: 30 g; Refill: 0     Follow up:  Follow up in 6 months

## 2022-10-30 NOTE — Progress Notes (Signed)
@Patient  ID: Nicholas Moss, male    DOB: April 20, 1970, 52 y.o.   MRN: [id number]  Chief Complaint  Patient presents with   Follow-up    Over all health     Eczema    Back of leg and neck     Referring provider: [facility]   HPI  Patient presents today for a follow up visit. He states that he has been having an eczema flare. We will trial kenalog cream. We discussed that he can start OTC flaxseed oil daily for previously elevated LDL. Will recheck at next visit. Denies f/c/s, n/v/d, hemoptysis, PND, leg swelling Denies chest pain or edema      No Known Allergies   There is no immunization history on file for this patient.  Past Medical History:  Diagnosis Date   Asthma    Eczema    Hypertension     Tobacco History: Social History   Tobacco Use  Smoking Status Never  Smokeless Tobacco Never   Counseling given: Not Answered   Outpatient Encounter Medications as of 10/30/2022  Medication Sig   albuterol (PROVENTIL) (2.5 MG/3ML) 0.083% nebulizer solution Take 3 mLs (2.5 mg total) by nebulization every 6 (six) hours as needed for wheezing or shortness of breath.   albuterol (VENTOLIN HFA) 108 (90 Base) MCG/ACT inhaler Inhale 2 puffs into the lungs every 6 (six) hours as needed for wheezing or shortness of breath.   fluticasone-salmeterol (ADVAIR) 250-50 MCG/ACT AEPB Inhale 1 puff into the lungs 2 (two) times daily.   naproxen (NAPROSYN) 500 MG tablet Take 1 tablet (500 mg total) by mouth 2 (two) times daily with a meal.   triamterene-hydrochlorothiazide (MAXZIDE-25) 37.5-25 MG tablet Take 1 tablet by mouth once daily   [DISCONTINUED] cyclobenzaprine (FLEXERIL) 10 MG tablet Take 1 tablet (10 mg total) by mouth 3 (three) times daily as needed for muscle spasms.   [DISCONTINUED] montelukast (SINGULAIR) 10 MG tablet Take 10 mg by mouth at bedtime.   [DISCONTINUED] triamcinolone cream (KENALOG) 0.1 % Apply 1 Application topically 2 (two) times daily.    acetaminophen (TYLENOL) 500 MG tablet Take 2 tablets (1,000 mg total) by mouth every 6 (six) hours as needed. (Patient not taking: Reported on 10/30/2022)   budesonide-formoterol (SYMBICORT) 160-4.5 MCG/ACT inhaler Inhale 2 puffs into the lungs 2 (two) times daily. (Patient not taking: Reported on 04/30/2022)   cyclobenzaprine (FLEXERIL) 10 MG tablet Take 1 tablet (10 mg total) by mouth 3 (three) times daily as needed for muscle spasms.   Diclofenac Sodium (PENNSAID) 2 % SOLN Place 2 Bottles onto the skin 2 (two) times daily as needed. (Patient not taking: Reported on 04/30/2022)   ergocalciferol (VITAMIN D2) 1.25 MG (50000 UT) capsule Take 50,000 Units by mouth once a week. (Patient not taking: Reported on 10/30/2022)   hydrOXYzine (VISTARIL) 25 MG capsule Take 25 mg by mouth 3 (three) times daily as needed. (Patient not taking: Reported on 10/30/2022)   ibuprofen (ADVIL,MOTRIN) 800 MG tablet Take 1 tablet (800 mg total) by mouth 3 (three) times daily. (Patient not taking: Reported on 04/30/2022)   methocarbamol (ROBAXIN) 500 MG tablet Take 2 tablets (1,000 mg total) by mouth every 6 (six) hours as needed for muscle spasms. (Patient not taking: Reported on 04/30/2022)   montelukast (SINGULAIR) 10 MG tablet Take 1 tablet (10 mg total) by mouth at bedtime.   predniSONE (DELTASONE) 10 MG tablet Take 2 tablets (20 mg total) by mouth daily. (Patient not taking: Reported on 04/30/2022)   triamcinolone  cream (KENALOG) 0.1 % Apply 1 Application topically 2 (two) times daily.   No facility-administered encounter medications on file as of 10/30/2022.     Review of Systems  Review of Systems  Constitutional: Negative.   HENT: Negative.    Cardiovascular: Negative.   Gastrointestinal: Negative.   Skin:  Positive for rash.  Allergic/Immunologic: Negative.   Neurological: Negative.   Psychiatric/Behavioral: Negative.         Physical Exam  BP 115/84   Pulse 71   Temp (!) 97.4 F (36.3 C)   Wt 168 lb  6.4 oz (76.4 kg)   SpO2 99%   BMI 28.02 kg/m   Wt Readings from Last 5 Encounters:  10/30/22 168 lb 6.4 oz (76.4 kg)  04/30/22 172 lb (78 kg)  12/19/21 171 lb (77.6 kg)  12/11/21 171 lb (77.6 kg)  06/16/21 174 lb (78.9 kg)     Physical Exam Vitals and nursing note reviewed.  Constitutional:      General: He is not in acute distress.    Appearance: He is well-developed.  Cardiovascular:     Rate and Rhythm: Normal rate and regular rhythm.  Pulmonary:     Effort: Pulmonary effort is normal.     Breath sounds: Normal breath sounds.  Skin:    General: Skin is warm and dry.     Findings: Rash present. Rash is macular and papular.       Neurological:     Mental Status: He is alert and oriented to person, place, and time.      Lab Results:  CBC    Component Value Date/Time   WBC 5.6 04/30/2022 1020   WBC 4.5 08/17/2020 1119   RBC 5.62 04/30/2022 1020   RBC 5.52 08/17/2020 1119   HGB 16.9 04/30/2022 1020   HCT 51.8 (H) 04/30/2022 1020   PLT 302 04/30/2022 1020   MCV 92 04/30/2022 1020   MCH 30.1 04/30/2022 1020   MCH 30.1 08/17/2020 1119   MCHC 32.6 04/30/2022 1020   MCHC 33.2 08/17/2020 1119   RDW 12.0 04/30/2022 1020    BMET    Component Value Date/Time   NA 140 04/30/2022 1020   K 3.9 04/30/2022 1020   CL 99 04/30/2022 1020   CO2 22 04/30/2022 1020   GLUCOSE 86 04/30/2022 1020   GLUCOSE 90 08/17/2020 1119   BUN 8 04/30/2022 1020   CREATININE 1.09 04/30/2022 1020   CALCIUM 10.1 04/30/2022 1020   GFRNONAA >60 08/17/2020 1119   GFRAA >60 04/27/2018 2153      Assessment & Plan:   Primary hypertension -continue current medications  -may start OTC flaxseed oil daily  2. Eczema, unspecified type  - triamcinolone cream (KENALOG) 0.1 %; Apply 1 Application topically 2 (two) times daily.  Dispense: 30 g; Refill: 0     Follow up:  Follow up in 6 months     Ivonne Andrew, NP 10/30/2022

## 2022-10-30 NOTE — Assessment & Plan Note (Signed)
-  continue current medications  -may start OTC flaxseed oil daily  2. Eczema, unspecified type  - triamcinolone cream (KENALOG) 0.1 %; Apply 1 Application topically 2 (two) times daily.  Dispense: 30 g; Refill: 0     Follow up:  Follow up in 6 months

## 2022-11-17 ENCOUNTER — Other Ambulatory Visit: Payer: Self-pay | Admitting: Nurse Practitioner

## 2022-11-17 DIAGNOSIS — J4521 Mild intermittent asthma with (acute) exacerbation: Secondary | ICD-10-CM

## 2022-12-15 ENCOUNTER — Other Ambulatory Visit: Payer: Self-pay | Admitting: Nurse Practitioner

## 2022-12-15 DIAGNOSIS — L309 Dermatitis, unspecified: Secondary | ICD-10-CM

## 2022-12-16 ENCOUNTER — Other Ambulatory Visit: Payer: Self-pay | Admitting: Nurse Practitioner

## 2022-12-16 DIAGNOSIS — J4521 Mild intermittent asthma with (acute) exacerbation: Secondary | ICD-10-CM

## 2023-01-15 ENCOUNTER — Other Ambulatory Visit: Payer: Self-pay | Admitting: Nurse Practitioner

## 2023-01-15 DIAGNOSIS — I10 Essential (primary) hypertension: Secondary | ICD-10-CM

## 2023-01-19 ENCOUNTER — Other Ambulatory Visit: Payer: Self-pay | Admitting: Nurse Practitioner

## 2023-01-19 DIAGNOSIS — L309 Dermatitis, unspecified: Secondary | ICD-10-CM

## 2023-01-19 DIAGNOSIS — J4521 Mild intermittent asthma with (acute) exacerbation: Secondary | ICD-10-CM

## 2023-01-19 NOTE — Telephone Encounter (Signed)
Please advise KH 

## 2023-02-01 ENCOUNTER — Encounter (HOSPITAL_COMMUNITY): Payer: Self-pay

## 2023-02-01 ENCOUNTER — Emergency Department (HOSPITAL_COMMUNITY): Payer: BC Managed Care – PPO

## 2023-02-01 ENCOUNTER — Other Ambulatory Visit: Payer: Self-pay

## 2023-02-01 ENCOUNTER — Emergency Department (HOSPITAL_COMMUNITY)
Admission: EM | Admit: 2023-02-01 | Discharge: 2023-02-01 | Disposition: A | Discharge status: Home / Self Care | Payer: BC Managed Care – PPO | Attending: Emergency Medicine | Admitting: Emergency Medicine

## 2023-02-01 DIAGNOSIS — M7989 Other specified soft tissue disorders: Secondary | ICD-10-CM | POA: Diagnosis not present

## 2023-02-01 DIAGNOSIS — M19031 Primary osteoarthritis, right wrist: Secondary | ICD-10-CM | POA: Diagnosis not present

## 2023-02-01 DIAGNOSIS — M25531 Pain in right wrist: Secondary | ICD-10-CM | POA: Diagnosis not present

## 2023-02-01 MED ORDER — KETOROLAC TROMETHAMINE 15 MG/ML IJ SOLN
15.0000 mg | Freq: Once | INTRAMUSCULAR | Status: AC
  Administered 2023-02-01: 15 mg via INTRAMUSCULAR
  Filled 2023-02-01: qty 1

## 2023-02-01 NOTE — ED Provider Notes (Signed)
[place] EMERGENCY DEPARTMENT AT [facility] Provider Note   CSN: [id number] Arrival date & time: 02/01/23  1032     History  Chief Complaint  Patient presents with   Wrist Pain    Nicholas Moss is a 52 y.o. male.  52 year old male presents today for concern of right wrist pain.  This started yesterday.  Has taken over-the-counter medication without significant relief.  He is right-hand dominant.  Works as a Copy.  Denies fever open wounds or other concerns.  The history is provided by the patient. No language interpreter was used.       Home Medications Prior to Admission medications   Medication Sig Start Date End Date Taking? Authorizing Provider  acetaminophen (TYLENOL) 500 MG tablet Take 2 tablets (1,000 mg total) by mouth every 6 (six) hours as needed. Patient not taking: Reported on 10/30/2022 12/11/21   Gailen Shelter, PA  albuterol (PROVENTIL) (2.5 MG/3ML) 0.083% nebulizer solution Take 3 mLs (2.5 mg total) by nebulization every 6 (six) hours as needed for wheezing or shortness of breath. 04/30/22   Ivonne Andrew, NP  albuterol (VENTOLIN HFA) 108 (90 Base) MCG/ACT inhaler Inhale 2 puffs into the lungs every 6 (six) hours as needed for wheezing or shortness of breath. 04/30/22   Ivonne Andrew, NP  budesonide-formoterol (SYMBICORT) 160-4.5 MCG/ACT inhaler Inhale 2 puffs into the lungs 2 (two) times daily. Patient not taking: Reported on 04/30/2022 04/02/22   Ivonne Andrew, NP  cyclobenzaprine (FLEXERIL) 10 MG tablet Take 1 tablet (10 mg total) by mouth 3 (three) times daily as needed for muscle spasms. 10/30/22   Ivonne Andrew, NP  Diclofenac Sodium (PENNSAID) 2 % SOLN Place 2 Bottles onto the skin 2 (two) times daily as needed. Patient not taking: Reported on 04/30/2022    [provider]  ergocalciferol (VITAMIN D2) 1.25 MG (50000 UT) capsule Take 50,000 Units by mouth once a week. Patient not taking: Reported on 10/30/2022    [provider]  fluticasone-salmeterol (ADVAIR) 250-50 MCG/ACT AEPB INHALE 1 DOSE BY MOUTH TWICE DAILY 01/19/23   Ivonne Andrew, NP  hydrOXYzine (VISTARIL) 25 MG capsule Take 25 mg by mouth 3 (three) times daily as needed. Patient not taking: Reported on 10/30/2022    [provider]  ibuprofen (ADVIL,MOTRIN) 800 MG tablet Take 1 tablet (800 mg total) by mouth 3 (three) times daily. Patient not taking: Reported on 04/30/2022 06/12/18   Tilden Fossa, MD  methocarbamol (ROBAXIN) 500 MG tablet Take 2 tablets (1,000 mg total) by mouth every 6 (six) hours as needed for muscle spasms. Patient not taking: Reported on 04/30/2022 06/12/18   Tilden Fossa, MD  montelukast (SINGULAIR) 10 MG tablet Take 1 tablet (10 mg total) by mouth at bedtime. 10/30/22 11/29/22  Ivonne Andrew, NP  naproxen (NAPROSYN) 500 MG tablet Take 1 tablet (500 mg total) by mouth 2 (two) times daily with a meal. 04/30/22   Ivonne Andrew, NP  predniSONE (DELTASONE) 10 MG tablet Take 2 tablets (20 mg total) by mouth daily. Patient not taking: Reported on 04/30/2022 08/17/20   Margarita Grizzle, MD  triamcinolone cream (KENALOG) 0.1 % APPLY  CREAM EXTERNALLY TWICE DAILY 01/19/23   Ivonne Andrew, NP  triamterene-hydrochlorothiazide [facility]) 37.5-25 MG tablet Take 1 tablet by mouth once daily 01/15/23   Ivonne Andrew, NP      Allergies    Patient has no known allergies.    Review of Systems   Review  of Systems  Constitutional:  Negative for chills and fever.  Musculoskeletal:  Positive for arthralgias and joint swelling.  All other systems reviewed and are negative.   Physical Exam Updated Vital Signs BP (!) 121/90 (BP Location: Right Arm)   Pulse 72   Temp 98.2 F (36.8 C) (Oral)   Resp 18   Ht 5\' 5"  (1.651 m)   Wt 76 kg   SpO2 100%   BMI 27.88 kg/m  Physical Exam Vitals and nursing note reviewed.  Constitutional:      General: He is not in acute distress.    Appearance: Normal appearance. He is not  ill-appearing.  HENT:     Head: Normocephalic and atraumatic.     Nose: Nose normal.  Eyes:     Conjunctiva/sclera: Conjunctivae normal.  Cardiovascular:     Rate and Rhythm: Normal rate and regular rhythm.  Pulmonary:     Effort: Pulmonary effort is normal. No respiratory distress.  Musculoskeletal:        General: No deformity. Normal range of motion.     Comments: Tenderness to palpation present over the right wrist.  Some joint swelling noted.  Decent range of motion.  Neurovascularly intact.  Skin:    Findings: No rash.  Neurological:     Mental Status: He is alert.     ED Results / Procedures / Treatments   Labs (all labs ordered are listed, but only abnormal results are displayed) Labs Reviewed - No data to display  EKG None  Radiology DG Wrist Complete Right  Result Date: 02/01/2023 CLINICAL DATA:  Pain and swelling EXAM: RIGHT WRIST - COMPLETE 3 VIEW COMPARISON:  X-ray 11/23/2019 hand FINDINGS: Mild degenerative changes of the wrist with some joint space loss along the radiocarpal joint and distal radioulnar joint. There are some well corticated densities about the proximal carpal row which may be the sequela of old trauma or accessory ossicles. These have increased in the area of the triangular fibrocartilage. No acute fracture or dislocation. Preserved bone mineralization. IMPRESSION: Increasing degenerative changes. Electronically Signed   By: Karen Kays M.D.   On: 02/01/2023 14:02    Procedures Procedures    Medications Ordered in ED Medications  ketorolac (TORADOL) 15 MG/ML injection 15 mg (15 mg Intramuscular Given 02/01/23 1123)    ED Course/ Medical Decision Making/ A&P                                 Medical Decision Making Amount and/or Complexity of Data Reviewed Radiology: ordered.  Risk Prescription drug management.   52 year old male presents with right wrist pain and swelling.  No acute finding on x-ray other than degenerative change.   I reviewed the x-ray and agree with radiology interpretation.  Will provide brace.  Symptomatic management discussed.  Will give anti-inflammatory and referral to hand specialist.  Patient agreeable with plan.  Return precautions discussed.  Patient voices understanding and is in agreement with plan.   Final Clinical Impression(s) / ED Diagnoses Final diagnoses:  Right wrist pain    Rx / DC Orders ED Discharge Orders     None         Marita Kansas, PA-C 02/01/23 1500    Cathren Laine, MD 02/01/23 2127

## 2023-02-01 NOTE — Discharge Instructions (Addendum)
No concerning findings on x-ray.  Use anti-inflammatory medication as discussed.  Have also given you referral to hand specialist if you do not have improvement.  Follow-up with the primary care provider or the hand specialist.  Return for any concerning symptoms.

## 2023-02-01 NOTE — ED Triage Notes (Signed)
Endorses R wrist pain. Denies any known injury. States R wrist swollen, started yesterday. Reports taking two Advil to help with pain. No relief. Radial Pulse palpable

## 2023-02-08 DIAGNOSIS — M25531 Pain in right wrist: Secondary | ICD-10-CM | POA: Diagnosis not present

## 2023-02-08 DIAGNOSIS — Z0000 Encounter for general adult medical examination without abnormal findings: Secondary | ICD-10-CM | POA: Diagnosis not present

## 2023-02-08 DIAGNOSIS — I10 Essential (primary) hypertension: Secondary | ICD-10-CM | POA: Diagnosis not present

## 2023-02-08 DIAGNOSIS — R7303 Prediabetes: Secondary | ICD-10-CM | POA: Diagnosis not present

## 2023-02-08 DIAGNOSIS — J4520 Mild intermittent asthma, uncomplicated: Secondary | ICD-10-CM | POA: Diagnosis not present

## 2023-02-08 DIAGNOSIS — Z1330 Encounter for screening examination for mental health and behavioral disorders, unspecified: Secondary | ICD-10-CM | POA: Diagnosis not present

## 2023-02-09 DIAGNOSIS — Z0000 Encounter for general adult medical examination without abnormal findings: Secondary | ICD-10-CM | POA: Diagnosis not present

## 2023-02-09 DIAGNOSIS — R7303 Prediabetes: Secondary | ICD-10-CM | POA: Diagnosis not present

## 2023-02-09 DIAGNOSIS — Z125 Encounter for screening for malignant neoplasm of prostate: Secondary | ICD-10-CM | POA: Diagnosis not present

## 2023-02-17 ENCOUNTER — Other Ambulatory Visit: Payer: Self-pay | Admitting: Nurse Practitioner

## 2023-02-17 DIAGNOSIS — L309 Dermatitis, unspecified: Secondary | ICD-10-CM

## 2023-04-13 ENCOUNTER — Emergency Department (HOSPITAL_COMMUNITY)
Admission: EM | Admit: 2023-04-13 | Discharge: 2023-04-13 | Disposition: A | Discharge status: Home / Self Care | Payer: Self-pay | Attending: Emergency Medicine | Admitting: Emergency Medicine

## 2023-04-13 ENCOUNTER — Other Ambulatory Visit: Payer: Self-pay

## 2023-04-13 ENCOUNTER — Encounter (HOSPITAL_COMMUNITY): Payer: Self-pay

## 2023-04-13 ENCOUNTER — Emergency Department (HOSPITAL_COMMUNITY): Payer: Self-pay

## 2023-04-13 DIAGNOSIS — R030 Elevated blood-pressure reading, without diagnosis of hypertension: Secondary | ICD-10-CM | POA: Insufficient documentation

## 2023-04-13 DIAGNOSIS — M25531 Pain in right wrist: Secondary | ICD-10-CM | POA: Insufficient documentation

## 2023-04-13 MED ORDER — OXYCODONE-ACETAMINOPHEN 5-325 MG PO TABS
1.0000 | ORAL_TABLET | Freq: Once | ORAL | Status: AC
  Administered 2023-04-13: 1 via ORAL
  Filled 2023-04-13: qty 1

## 2023-04-13 MED ORDER — OXYCODONE-ACETAMINOPHEN 5-325 MG PO TABS: 1.0000 | ORAL_TABLET | Freq: Four times a day (QID) | ORAL | 0 refills | Status: AC | PRN

## 2023-04-13 MED ORDER — PREDNISONE 20 MG PO TABS: ORAL_TABLET | ORAL | 0 refills | Status: DC

## 2023-04-13 MED ORDER — OXYCODONE-ACETAMINOPHEN 5-325 MG PO TABS
1.0000 | ORAL_TABLET | Freq: Four times a day (QID) | ORAL | 0 refills | Status: DC | PRN
Start: 2023-04-13 — End: 2023-04-13

## 2023-04-13 MED ORDER — PREDNISONE 20 MG PO TABS
60.0000 mg | ORAL_TABLET | Freq: Once | ORAL | Status: AC
  Administered 2023-04-13: 60 mg via ORAL
  Filled 2023-04-13: qty 3

## 2023-04-13 MED ORDER — PREDNISONE 20 MG PO TABS: ORAL_TABLET | ORAL | 0 refills | Status: AC

## 2023-04-13 NOTE — ED Provider Notes (Signed)
 [place] EMERGENCY DEPARTMENT AT [facility] Provider Note   CSN: [id number] Arrival date & time: 04/13/23  1507     History  Chief Complaint  Patient presents with   Wrist Pain    Curties Conigliaro is a 52 y.o. male.  Pt with c/o right wrist pain in past few days. Dull pain, non radiating, worse w movement and palpation. Denies specific trauma or injury. 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 Hx similar pain in same wrist in past, pt unsure of cause. No hx gout or pseudogout. No other joint pain. No myalgias. No fever, chills or sweats. No hand or finger numbness/weakness. No arm swelling.   The history is provided by the patient, medical records and a significant other.  Wrist Pain Pertinent negatives include no chest pain, no headaches and no shortness of breath.       Home Medications Prior to Admission medications   Medication Sig Start Date End Date Taking? Authorizing Provider  acetaminophen  (TYLENOL ) 500 MG tablet Take 2 tablets (1,000 mg total) by mouth every 6 (six) hours as needed. Patient not taking: Reported on 10/30/2022 12/11/21   Neldon Hamp RAMAN, PA  albuterol  (PROVENTIL ) (2.5 MG/3ML) 0.083% nebulizer solution Take 3 mLs (2.5 mg total) by nebulization every 6 (six) hours as needed for wheezing or shortness of breath. 04/30/22   Oley Bascom RAMAN, NP  albuterol  (VENTOLIN  HFA) 108 (90 Base) MCG/ACT inhaler Inhale 2 puffs into the lungs every 6 (six) hours as needed for wheezing or shortness of breath. 04/30/22   Oley Bascom RAMAN, NP  budesonide -formoterol  (SYMBICORT ) 160-4.5 MCG/ACT inhaler Inhale 2 puffs into the lungs 2 (two) times daily. Patient not taking: Reported on 04/30/2022 04/02/22   Oley Bascom RAMAN, NP  cyclobenzaprine  (FLEXERIL ) 10 MG tablet Take 1 tablet (10 mg total) by mouth 3 (three) times daily as needed for muscle spasms. 10/30/22   Nichols, Tonya S, NP  Diclofenac  Sodium (PENNSAID ) 2 % SOLN Place  2 Bottles onto the skin 2 (two) times daily as needed. Patient not taking: Reported on 04/30/2022    [provider]  ergocalciferol (VITAMIN D2) 1.25 MG (50000 UT) capsule Take 50,000 Units by mouth once a week. Patient not taking: Reported on 10/30/2022    [provider]  fluticasone -salmeterol (ADVAIR) 250-50 MCG/ACT AEPB INHALE 1 DOSE BY MOUTH TWICE DAILY 01/19/23   Nichols, Tonya S, NP  hydrOXYzine (VISTARIL) 25 MG capsule Take 25 mg by mouth 3 (three) times daily as needed. Patient not taking: Reported on 10/30/2022    [provider]  ibuprofen  (ADVIL ,MOTRIN ) 800 MG tablet Take 1 tablet (800 mg total) by mouth 3 (three) times daily. Patient not taking: Reported on 04/30/2022 06/12/18   Griselda Norris, MD  methocarbamol  (ROBAXIN ) 500 MG tablet Take 2 tablets (1,000 mg total) by mouth every 6 (six) hours as needed for muscle spasms. Patient not taking: Reported on 04/30/2022 06/12/18   Griselda Norris, MD  montelukast  (SINGULAIR ) 10 MG tablet Take 1 tablet (10 mg total) by mouth at bedtime. 10/30/22 11/29/22  Oley Bascom RAMAN, NP  naproxen  (NAPROSYN ) 500 MG tablet Take 1 tablet (500 mg total) by mouth 2 (two) times daily with a meal. 04/30/22   Oley Bascom RAMAN, NP  predniSONE  (DELTASONE ) 10 MG tablet Take 2 tablets (20 mg total) by mouth daily. Patient not taking: Reported on 04/30/2022 08/17/20   Levander Houston, MD  triamcinolone  cream (KENALOG ) 0.1 % APPLY  CREAM EXTERNALLY TWICE DAILY  02/17/23   Oley Bascom RAMAN, NP  triamterene -hydrochlorothiazide (MAXZIDE-25) 37.5-25 MG tablet Take 1 tablet by mouth once daily 01/15/23   Nichols, Tonya S, NP      Allergies    Patient has no known allergies.    Review of Systems   Review of Systems  Constitutional:  Negative for chills, diaphoresis and fever.  Respiratory:  Negative for shortness of breath.   Cardiovascular:  Negative for chest pain.  Musculoskeletal:        Right wrist pain  Skin:  Negative for rash and wound.   Neurological:  Negative for weakness, numbness and headaches.    Physical Exam Updated Vital Signs BP (!) 128/96 (BP Location: Right Arm)   Pulse 87   Temp 98.9 F (37.2 C) (Oral)   Resp 18   Ht 1.651 m (5' 5)   Wt 74.8 kg   SpO2 97%   BMI 27.46 kg/m  Physical Exam Vitals and nursing note reviewed.  Constitutional:      Appearance: Normal appearance. He is well-developed.  HENT:     Head: Atraumatic.     Nose: Nose normal.     Mouth/Throat:     Mouth: Mucous membranes are moist.  Eyes:     General: No scleral icterus.    Conjunctiva/sclera: Conjunctivae normal.  Neck:     Trachea: No tracheal deviation.  Cardiovascular:     Rate and Rhythm: Normal rate.     Pulses: Normal pulses.  Pulmonary:     Effort: Pulmonary effort is normal. No accessory muscle usage or respiratory distress.  Musculoskeletal:     Cervical back: Neck supple.     Comments: Tenderness and mild swelling to right wrist. No erythema or marked increased in warmth. Hand is of normal color and warmth. Radial pulse 2+. Hand nvi with intact motor/sens fxn. No pain w passive rom wrist/digits. Normal cap refill distally in digits. Skin intact, no lesions. No cellulitis. No forearm swelling. No rue l/a.   Skin:    General: Skin is warm and dry.     Findings: No rash.  Neurological:     Mental Status: He is alert.     Comments: Alert, speech clear. Right arm/hand nvi w intact motor/sens fxn.   Psychiatric:        Mood and Affect: Mood normal.     ED Results / Procedures / Treatments   Labs (all labs ordered are listed, but only abnormal results are displayed) Labs Reviewed - No data to display  EKG None  Radiology DG Hand Complete Right Result Date: 04/13/2023 CLINICAL DATA:  Hand swelling, initial encounter EXAM: RIGHT HAND - COMPLETE 3+ VIEW COMPARISON:  11/23/2019 FINDINGS: Chronic degenerative changes are noted about the wrist joint. No acute fracture or dislocation is seen. No soft tissue  abnormality is noted. IMPRESSION: No acute abnormality noted. Electronically Signed   By: Oneil Devonshire M.D.   On: 04/13/2023 18:21   DG Wrist Complete Right Result Date: 04/13/2023 CLINICAL DATA:  Wrist pain for several days, no known injury, initial encounter EXAM: RIGHT WRIST - COMPLETE 3+ VIEW COMPARISON:  02/01/2023 FINDINGS: Widening of the scapholunate space is noted suggestive of ligamentous injury. 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 No acute fracture or dislocation is seen. No soft tissue abnormality is noted. IMPRESSION: Chronic changes without acute abnormality. Widening of the scapholunate space better visualized on today's exam consistent with ligamentous injury. Electronically Signed   By: Oneil Devonshire HERO.D.  On: 04/13/2023 18:21    Procedures Procedures    Medications Ordered in ED Medications  oxyCODONE -acetaminophen  (PERCOCET/ROXICET) 5-325 MG per tablet 1 tablet (has no administration in time range)  predniSONE  (DELTASONE ) tablet 60 mg (has no administration in time range)    ED Course/ Medical Decision Making/ A&P                                 Medical Decision Making Problems Addressed: Elevated blood pressure reading: acute illness or injury Right wrist pain: acute illness or injury  Amount and/or Complexity of Data Reviewed External Data Reviewed: notes. Radiology: ordered and independent interpretation performed. Decision-making details documented in ED Course.  Risk Prescription drug management.   Imaging.   Reviewed nursing notes and prior charts for additional history.   Xrays reviewed/interpreted by me - degenerative changes.   Wrist brace/splint.   Prednisone  po. Percocet po (pt has ride).   Rx for home. Outpatient hand f/u.  Return precautions provided.         Final Clinical Impression(s) / ED Diagnoses Final diagnoses:  None    Rx / DC Orders ED Discharge Orders     None          Bernard Drivers, MD 04/13/23 2119

## 2023-04-13 NOTE — ED Triage Notes (Signed)
 Pt reports with right wrist pain x 1.5 weeks. Pt denies injury.

## 2023-04-13 NOTE — Discharge Instructions (Addendum)
 It was our pleasure to provide your ER care today - we hope that you feel better.  May wear wrist brace for comfort/support as need for the next 3-4 days.   Take prednisone  as prescribed. You may also take percocet as need for pain. No driving for the next 6 hours or when taking percocet. Also, do not take tylenol  or acetaminophen  containing medication when taking percocet.   Follow up with hand/wrist specialist in the coming week if symptoms fail to improve/resolve.  Your blood pressure is mildly high today - follow up with primary care doctor in 1-2 weeks.   Return to ER if worse, new symptoms, fevers, increased swelling/redness, severe or intractable pain, numbness/weakness, or other concern.

## 2023-04-13 NOTE — ED Provider Triage Note (Signed)
 Emergency Medicine Provider Triage Evaluation Note  Nicholas Moss , a 52 y.o. male  was evaluated in triage.  Pt complains of right wrist swelling and pain.  He states it has been ongoing for 1.5 week.  Denies injury to the wrist.  He has been taking ibuprofen .   Review of Systems  Positive: As above Negative: As above  Physical Exam  BP 130/88 (BP Location: Right Arm)   Pulse 91   Temp 97.9 F (36.6 C) (Oral)   Resp 18   Ht 5' 5 (1.651 m)   Wt 74.8 kg   SpO2 100%   BMI 27.46 kg/m  Gen:   Awake, no distress   Resp:  Normal effort  MSK:   Moves extremities without difficulty  Other:  Right wrist with swelling, no erythema, or increased warmth  Medical Decision Making  Medically screening exam initiated at 3:28 PM.  Appropriate orders placed.  Wiatt Mahabir was informed that the remainder of the evaluation will be completed by another provider, this initial triage assessment does not replace that evaluation, and the importance of remaining in the ED until their evaluation is complete.     Gretta Sayres R, PA-C 04/13/23 1529

## 2023-05-07 ENCOUNTER — Ambulatory Visit: Payer: Self-pay | Admitting: Nurse Practitioner

## 2023-06-02 ENCOUNTER — Other Ambulatory Visit: Payer: Self-pay | Admitting: Nurse Practitioner

## 2023-06-02 DIAGNOSIS — L309 Dermatitis, unspecified: Secondary | ICD-10-CM
